# Patient Record
Sex: Female | Born: 1950 | Race: White | Hispanic: No | Marital: Married | Smoking: Former smoker
Health system: Southern US, Community
[De-identification: ages and names within clinical notes are randomized; demographics above are authoritative.]

## PROBLEM LIST (undated history)

## (undated) DIAGNOSIS — R42 Dizziness and giddiness: Secondary | ICD-10-CM

## (undated) DIAGNOSIS — E119 Type 2 diabetes mellitus without complications: Secondary | ICD-10-CM

## (undated) DIAGNOSIS — E785 Hyperlipidemia, unspecified: Secondary | ICD-10-CM

## (undated) DIAGNOSIS — F419 Anxiety disorder, unspecified: Secondary | ICD-10-CM

## (undated) DIAGNOSIS — E039 Hypothyroidism, unspecified: Secondary | ICD-10-CM

## (undated) DIAGNOSIS — K219 Gastro-esophageal reflux disease without esophagitis: Secondary | ICD-10-CM

## (undated) DIAGNOSIS — I10 Essential (primary) hypertension: Secondary | ICD-10-CM

## (undated) HISTORY — PX: APPENDECTOMY: SHX54

## (undated) HISTORY — PX: NO PAST SURGERIES: SHX2092

## (undated) HISTORY — DX: Anxiety disorder, unspecified: F41.9

## (undated) HISTORY — DX: Type 2 diabetes mellitus without complications: E11.9

## (undated) HISTORY — DX: Essential (primary) hypertension: I10

## (undated) HISTORY — DX: Hyperlipidemia, unspecified: E78.5

---

## 1999-09-05 LAB — HM MAMMOGRAPHY: HM MAMMO: NORMAL

## 2010-01-02 ENCOUNTER — Ambulatory Visit: Payer: Self-pay | Admitting: Family Medicine

## 2011-01-24 ENCOUNTER — Ambulatory Visit: Payer: Self-pay | Admitting: Family Medicine

## 2012-05-21 ENCOUNTER — Ambulatory Visit: Payer: Self-pay | Admitting: Family Medicine

## 2014-07-04 LAB — HEMOGLOBIN A1C: Hgb A1c MFr Bld: 6.4 % — AB (ref 4.0–6.0)

## 2014-10-27 LAB — LIPID PANEL
Cholesterol: 158 mg/dL (ref 0–200)
HDL: 62 mg/dL (ref 35–70)
LDL CALC: 78 mg/dL
Triglycerides: 92 mg/dL (ref 40–160)

## 2014-10-27 LAB — HEPATIC FUNCTION PANEL
ALT: 51 U/L — AB (ref 7–35)
AST: 37 U/L — AB (ref 13–35)

## 2014-10-27 LAB — BASIC METABOLIC PANEL
BUN: 18 mg/dL (ref 4–21)
CREATININE: 0.8 mg/dL (ref 0.5–1.1)
GLUCOSE: 151 mg/dL
Potassium: 4.6 mmol/L (ref 3.4–5.3)
Sodium: 141 mmol/L (ref 137–147)

## 2015-02-26 ENCOUNTER — Other Ambulatory Visit: Payer: Self-pay | Admitting: Family Medicine

## 2015-02-26 DIAGNOSIS — E119 Type 2 diabetes mellitus without complications: Secondary | ICD-10-CM

## 2015-04-15 DIAGNOSIS — Z23 Encounter for immunization: Secondary | ICD-10-CM | POA: Diagnosis not present

## 2015-05-02 DIAGNOSIS — F439 Reaction to severe stress, unspecified: Secondary | ICD-10-CM

## 2015-05-02 DIAGNOSIS — E119 Type 2 diabetes mellitus without complications: Secondary | ICD-10-CM | POA: Insufficient documentation

## 2015-05-02 DIAGNOSIS — E1159 Type 2 diabetes mellitus with other circulatory complications: Secondary | ICD-10-CM | POA: Insufficient documentation

## 2015-05-02 DIAGNOSIS — I10 Essential (primary) hypertension: Secondary | ICD-10-CM

## 2015-05-02 DIAGNOSIS — E785 Hyperlipidemia, unspecified: Secondary | ICD-10-CM

## 2015-05-02 DIAGNOSIS — F419 Anxiety disorder, unspecified: Secondary | ICD-10-CM

## 2015-05-02 DIAGNOSIS — E1169 Type 2 diabetes mellitus with other specified complication: Secondary | ICD-10-CM | POA: Insufficient documentation

## 2015-05-04 ENCOUNTER — Ambulatory Visit: Payer: Self-pay | Admitting: Family Medicine

## 2015-05-23 ENCOUNTER — Other Ambulatory Visit: Payer: Self-pay | Admitting: Family Medicine

## 2015-05-23 DIAGNOSIS — E1169 Type 2 diabetes mellitus with other specified complication: Secondary | ICD-10-CM

## 2015-05-23 DIAGNOSIS — E785 Hyperlipidemia, unspecified: Principal | ICD-10-CM

## 2015-05-23 MED ORDER — ATORVASTATIN CALCIUM 40 MG PO TABS: 40.0000 mg | ORAL_TABLET | Freq: Every day | ORAL | Status: DC

## 2015-05-23 NOTE — Telephone Encounter (Signed)
Patient has been advised. KW 

## 2015-05-23 NOTE — Telephone Encounter (Signed)
Refill request. KW 

## 2015-05-23 NOTE — Telephone Encounter (Signed)
Med refilled.

## 2015-05-23 NOTE — Telephone Encounter (Signed)
Pt need s refill on atorvastatin 40.mg  Bevelyn Buckles  Call bc #  [phone]  Thanks Con Memos

## 2015-05-29 ENCOUNTER — Encounter: Payer: Self-pay | Admitting: Family Medicine

## 2015-05-29 ENCOUNTER — Ambulatory Visit (INDEPENDENT_AMBULATORY_CARE_PROVIDER_SITE_OTHER): Payer: Medicare Other | Admitting: Family Medicine

## 2015-05-29 VITALS — BP 110/76 | HR 83 | Temp 98.7°F | Resp 16 | Wt 200.0 lb

## 2015-05-29 DIAGNOSIS — E119 Type 2 diabetes mellitus without complications: Secondary | ICD-10-CM | POA: Diagnosis not present

## 2015-05-29 DIAGNOSIS — Z23 Encounter for immunization: Secondary | ICD-10-CM | POA: Diagnosis not present

## 2015-05-29 DIAGNOSIS — I10 Essential (primary) hypertension: Secondary | ICD-10-CM | POA: Diagnosis not present

## 2015-05-29 LAB — POCT GLYCOSYLATED HEMOGLOBIN (HGB A1C)

## 2015-05-29 NOTE — Progress Notes (Signed)
Subjective:     Patient ID: Joyce Morton, female   DOB: 11-15-1950, 64 y.o.   MRN: [id number]  HPI  Chief Complaint  Patient presents with  . Hypertension    Patient is present in office today for a 62month follow up lov 10/18/14. Patients blood pressure at last visit was 130/74 and she was advised to continue Chlorthalidone and  Losartan. Patient reports good compliance, tolerance and symptom control  . Diabetes    Follow up from 10/18/14, patients HgbA1C in house was 6.8%, she was advised to continue Metformin HCI 500mg . Patient states that her fasting blood sugars have been >130 but in the evening her blood sugar has remained in the 90s. Patient reports good ccompliance, tolerance and symptom control while on medication. Patient denies any hypoglycemia incidents and she inspecting feet for wounds and sores on a daily basis  . Hyperlipidemia    Follow up from 10/18/14, at visit Lipid panel was ordered  States her mother will be moving to a continuing care [place] her sister lives in January. She also retired in November. Reports stress is much less in her life as she has more time for herself.   Review of Systems  Respiratory: Negative for shortness of breath.   Cardiovascular: Negative for chest pain and palpitations.       Objective:   Physical Exam  Constitutional: She appears well-developed and well-nourished. No distress.  Cardiovascular: Normal rate and regular rhythm.   Pulmonary/Chest: Breath sounds normal.  Musculoskeletal: She exhibits no edema (of lower extremities).       Assessment:    1. Type 2 diabetes mellitus without complication, without long-term current use of insulin (HCC) - POCT glycosylated hemoglobin (Hb A1C)  2. Need for Streptococcus pneumoniae vaccination - Pneumococcal conjugate vaccine 13-valent IM  3. Hypertension, essential    Plan:    Continue present medications. Encouraged regular exercise. Update labs at next o.v.

## 2015-05-29 NOTE — Patient Instructions (Signed)
Encourage daily exercise for 30 minutes or greater.

## 2015-09-11 ENCOUNTER — Other Ambulatory Visit: Payer: Self-pay | Admitting: Family Medicine

## 2015-09-11 NOTE — Telephone Encounter (Signed)
Joyce Morton I don't believe this patient sees you.  Thanks [employer]

## 2015-09-24 ENCOUNTER — Other Ambulatory Visit: Payer: Self-pay | Admitting: Family Medicine

## 2015-11-01 ENCOUNTER — Emergency Department
Admission: EM | Admit: 2015-11-01 | Discharge: 2015-11-01 | Disposition: A | Discharge status: Home / Self Care | Payer: Medicare Other | Attending: Emergency Medicine | Admitting: Emergency Medicine

## 2015-11-01 ENCOUNTER — Emergency Department: Payer: Medicare Other

## 2015-11-01 DIAGNOSIS — Z79899 Other long term (current) drug therapy: Secondary | ICD-10-CM | POA: Insufficient documentation

## 2015-11-01 DIAGNOSIS — Y999 Unspecified external cause status: Secondary | ICD-10-CM | POA: Diagnosis not present

## 2015-11-01 DIAGNOSIS — S92352A Displaced fracture of fifth metatarsal bone, left foot, initial encounter for closed fracture: Secondary | ICD-10-CM | POA: Diagnosis not present

## 2015-11-01 DIAGNOSIS — I10 Essential (primary) hypertension: Secondary | ICD-10-CM | POA: Diagnosis not present

## 2015-11-01 DIAGNOSIS — S92312A Displaced fracture of first metatarsal bone, left foot, initial encounter for closed fracture: Secondary | ICD-10-CM | POA: Diagnosis not present

## 2015-11-01 DIAGNOSIS — S92302A Fracture of unspecified metatarsal bone(s), left foot, initial encounter for closed fracture: Secondary | ICD-10-CM

## 2015-11-01 DIAGNOSIS — Y929 Unspecified place or not applicable: Secondary | ICD-10-CM | POA: Insufficient documentation

## 2015-11-01 DIAGNOSIS — S99922A Unspecified injury of left foot, initial encounter: Secondary | ICD-10-CM | POA: Diagnosis present

## 2015-11-01 DIAGNOSIS — E785 Hyperlipidemia, unspecified: Secondary | ICD-10-CM | POA: Diagnosis not present

## 2015-11-01 DIAGNOSIS — E119 Type 2 diabetes mellitus without complications: Secondary | ICD-10-CM | POA: Insufficient documentation

## 2015-11-01 DIAGNOSIS — Z87891 Personal history of nicotine dependence: Secondary | ICD-10-CM | POA: Diagnosis not present

## 2015-11-01 DIAGNOSIS — Y939 Activity, unspecified: Secondary | ICD-10-CM | POA: Diagnosis not present

## 2015-11-01 DIAGNOSIS — X501XXA Overexertion from prolonged static or awkward postures, initial encounter: Secondary | ICD-10-CM | POA: Diagnosis not present

## 2015-11-01 DIAGNOSIS — Z7984 Long term (current) use of oral hypoglycemic drugs: Secondary | ICD-10-CM | POA: Diagnosis not present

## 2015-11-01 DIAGNOSIS — S92355A Nondisplaced fracture of fifth metatarsal bone, left foot, initial encounter for closed fracture: Secondary | ICD-10-CM | POA: Diagnosis not present

## 2015-11-01 MED ORDER — OXYCODONE-ACETAMINOPHEN 5-325 MG PO TABS
1.0000 | ORAL_TABLET | Freq: Once | ORAL | Status: DC
  Filled 2015-11-01: qty 1

## 2015-11-01 MED ORDER — OXYCODONE-ACETAMINOPHEN 5-325 MG PO TABS: 1.0000 | ORAL_TABLET | Freq: Four times a day (QID) | ORAL | Status: DC | PRN

## 2015-11-01 NOTE — Discharge Instructions (Signed)
Metatarsal Fracture A metatarsal fracture is a break in a metatarsal bone. Metatarsal bones connect your toe bones to your ankle bones. CAUSES This type of fracture may be caused by:  A sudden twisting of your foot.  A fall onto your foot.  Overuse or repetitive exercise. RISK FACTORS This condition is more likely to develop in people who:  Play contact sports.  Have a bone disease.  Have a low calcium level. SYMPTOMS Symptoms of this condition include:  Pain that is worse when walking or standing.  Pain when pressing on the foot or moving the toes.  Swelling.  Bruising on the top or bottom of the foot.  A foot that appears shorter than the other one. DIAGNOSIS This condition is diagnosed with a physical exam. You may also have imaging tests, such as:  X-rays.  A CT scan.  MRI. TREATMENT Treatment for this condition depends on its severity and whether a bone has moved out of place. Treatment may involve:  Rest.  Wearing foot support such as a cast, splint, or boot for several weeks.  Using crutches.  Surgery to move bones back into the right position. Surgery is usually needed if there are many pieces of broken bone or bones that are very out of place (displaced fracture).  Physical therapy. This may be needed to help you regain full movement and strength in your foot. You will need to return to your health care provider to have X-rays taken until your bones heal. Your health care provider will look at the X-rays to make sure that your foot is healing well. HOME CARE INSTRUCTIONS  If You Have a Cast:  Do not stick anything inside the cast to scratch your skin. Doing that increases your risk of infection.  Check the skin around the cast every day. Report any concerns to your health care provider. You may put lotion on dry skin around the edges of the cast. Do not apply lotion to the skin underneath the cast.  Keep the cast clean and dry. If You Have a Splint  or a Supportive Boot:  Wear it as directed by your health care provider. Remove it only as directed by your health care provider.  Loosen it if your toes become numb and tingle, or if they turn cold and blue.  Keep it clean and dry. Bathing  Do not take baths, swim, or use a hot tub until your health care provider approves. Ask your health care provider if you can take showers. You may only be allowed to take sponge baths for bathing.  If your health care provider approves bathing and showering, cover the cast or splint with a watertight plastic bag to protect it from water. Do not let the cast or splint get wet. Managing Pain, Stiffness, and Swelling  If directed, apply ice to the injured area (if you have a splint, not a cast).  Put ice in a plastic bag.  Place a towel between your skin and the bag.  Leave the ice on for 20 minutes, 2-3 times per day.  Move your toes often to avoid stiffness and to lessen swelling.  Raise (elevate) the injured area above the level of your heart while you are sitting or lying down. Driving  Do not drive or operate heavy machinery while taking pain medicine.  Do not drive while wearing foot support on a foot that you use for driving. Activity  Return to your normal activities as directed by your health care   provider. Ask your health care provider what activities are safe for you.  Perform exercises as directed by your health care provider or physical therapist. Safety  Do not use the injured foot to support your body weight until your health care provider says that you can. Use crutches as directed by your health care provider. General Instructions  Do not put pressure on any part of the cast or splint until it is fully hardened. This may take several hours.  Do not use any tobacco products, including cigarettes, chewing tobacco, or e-cigarettes. Tobacco can delay bone healing. If you need help quitting, ask your health care  provider.  Take medicines only as directed by your health care provider.  Keep all follow-up visits as directed by your health care provider. This is important. SEEK MEDICAL CARE IF:  You have a fever.  Your cast, splint, or boot is too loose or too tight.  Your cast, splint, or boot is damaged.  Your pain medicine is not helping.  You have pain, tingling, or numbness in your foot that is not going away. SEEK IMMEDIATE MEDICAL CARE IF:  You have severe pain.  You have tingling or numbness in your foot that is getting worse.  Your foot feels cold or becomes numb.  Your foot changes color.   This information is not intended to replace advice given to you by your health care provider. Make sure you discuss any questions you have with your health care provider.   Document Released: 03/09/2002 Document Revised: 11/01/2014 Document Reviewed: 04/13/2014 Elsevier Interactive Patient Education 2016 [employer]  

## 2015-11-01 NOTE — ED Provider Notes (Signed)
[facility] Emergency Department Provider Note   ____________________________________________  Time seen: Approximately 6:34 AM  I have reviewed the triage vital signs and the nursing notes.   HISTORY  Chief Complaint Foot Injury    HPI Joyce Morton is a 65 y.o. female comes into the hospital today with some left foot pain. She reports that she twisted her foot yesterday. She thought it was a bad sprain. To put ice on it last night and took an ibuprofen but this morning when she got up she couldn't put weight on the foot. She reports that when she steps on it her pain as a 10 out of 10 in intensity but when she sitting it doesn't hurt too bad. It does hurt to move and she is concerned something may be wrong with it. The patient is still able to move her feet and her toes and has no tingling or numbness. She is here for evaluation this morning.   Past Medical History  Diagnosis Date  . Hyperlipidemia   . Hypertension   . Anxiety   . Diabetes mellitus without complication [facility])     Patient Active Problem List   Diagnosis Date Noted  . Hyperlipidemia 05/02/2015  . Anxiety disorder 05/02/2015  . Diabetes mellitus, type II ([place]) 05/02/2015  . Hypertension, essential 05/02/2015    No past surgical history on file.  Current Outpatient Rx  Name  Route  Sig  Dispense  Refill  . atorvastatin (LIPITOR) 40 MG tablet   Oral   Take 1 tablet (40 mg total) by mouth daily.   90 tablet   3   . chlorthalidone (HYGROTON) 25 MG tablet      TAKE 1 TABLET BY MOUTH DAILY.   90 tablet   1   . losartan (COZAAR) 50 MG tablet      TAKE 1 TABLET BY MOUTH EVERY DAY.   90 tablet   0   . metFORMIN (GLUCOPHAGE) 500 MG tablet      Take 1 tablet by mouth two  times daily   180 tablet   1   . oxyCODONE-acetaminophen (ROXICET) 5-325 MG tablet   Oral   Take 1 tablet by mouth every 6 (six) hours as needed.   12 tablet   0     Allergies Lisinopril  and Wellbutrin  No family history on file.  Social History Social History  Substance Use Topics  . Smoking status: Former Research scientist (life sciences)  . Smokeless tobacco: Not on file  . Alcohol Use: No    Review of Systems Constitutional: No fever/chills Eyes: No visual changes. ENT: No sore throat. Cardiovascular: Denies chest pain. Respiratory: Denies shortness of breath. Gastrointestinal: No abdominal pain.  No nausea, no vomiting.  No diarrhea.  No constipation. Genitourinary: Negative for dysuria. Musculoskeletal: Foot pain. Skin: Negative for rash. Neurological: Negative for headaches, focal weakness or numbness.  10-point ROS otherwise negative.  ____________________________________________   PHYSICAL EXAM:  VITAL SIGNS: ED Triage Vitals  Enc Vitals Group     BP 11/01/15 0610 154/84 mmHg     Pulse Rate 11/01/15 0610 109     Resp 11/01/15 0610 18     Temp 11/01/15 0610 98.7 F (37.1 C)     Temp src --      SpO2 11/01/15 0610 94 %     Weight 11/01/15 0610 199 lb (90.266 kg)     Height 11/01/15 0610 5\' 3"  (1.6 m)     Head Cir --  Peak Flow --      Pain Score --      Pain Loc --      Pain Edu? --      Excl. in [place]? --     Constitutional: Alert and oriented. Well appearing and in Mild distress. Eyes: Conjunctivae are normal. PERRL. EOMI. Head: Atraumatic. Nose: No congestion/rhinnorhea. Mouth/Throat: Mucous membranes are moist.  Oropharynx non-erythematous. Cardiovascular: Normal rate, regular rhythm. Grossly normal heart sounds.  Good peripheral circulation. Respiratory: Normal respiratory effort.  No retractions. Lungs CTAB. Gastrointestinal: Soft and nontender. No distention. Positive bowel sounds Musculoskeletal: Left foot pain with movement and to palpation laterally Neurologic:  Normal speech and language.  Skin:  Skin is warm, dry and intact.  Psychiatric: Mood and affect are normal.   ____________________________________________   LABS (all labs ordered are  listed, but only abnormal results are displayed)  Labs Reviewed - No data to display ____________________________________________  EKG  none ____________________________________________  RADIOLOGY  Left foot x-ray: Transverse fracture of the base of the left fifth metatarsal bone. ____________________________________________   PROCEDURES  Procedure(s) performed: please, see procedure note(s).   SPLINT APPLICATION Date/Time: [id number] AM Authorized by: Charlesetta Ivory P Consent: Verbal consent obtained. Risks and benefits: risks, benefits and alternatives were discussed Consent given by: patient Splint applied by: ED technician Location details: left foot Splint type: posterior  Supplies used: orthoglass Post-procedure: The splinted body part was neurovascularly unchanged following the procedure. Patient tolerance: Patient tolerated the procedure well with no immediate complications.   Critical Care performed: No  ____________________________________________   INITIAL IMPRESSION / ASSESSMENT AND PLAN / ED COURSE  Pertinent labs & imaging results that were available during my care of the patient were reviewed by me and considered in my medical decision making (see chart for details).  This is a 65 year old female who comes into the hospital today with some pain to her left lateral foot after twisting it yesterday. The patient reports it hurts a lot to put weight on her foot. It appears to the patient does have a transverse fracture of the base of the left fifth metatarsal. I will have her placed in a posterior short leg cast that goes under her foot and put her on crutches. I'll have her follow back up with orthopedic surgery. ____________________________________________   FINAL CLINICAL IMPRESSION(S) / ED DIAGNOSES  Final diagnoses:  Fracture of fifth metatarsal bone, left, closed, initial encounter      NEW MEDICATIONS STARTED DURING THIS VISIT:  New  Prescriptions   OXYCODONE-ACETAMINOPHEN (ROXICET) 5-325 MG TABLET    Take 1 tablet by mouth every 6 (six) hours as needed.     Note:  This document was prepared using Dragon voice recognition software and may include unintentional dictation errors.    Loney Hering, MD 11/01/15 [phone]

## 2015-11-01 NOTE — ED Notes (Signed)
Pt refuses crutches, states that she has a walker that she is going to use at home. Pt alert and oriented X4, active, cooperative, pt in NAD. RR even and unlabored, color WNL.

## 2015-11-01 NOTE — ED Notes (Signed)
Pt missed a step and injured left foot yesterday

## 2015-11-01 NOTE — ED Notes (Signed)
Pt's states that she is not in any pain right now, but when she puts any weight onto foot it is a 10/10 pain.

## 2015-11-01 NOTE — ED Notes (Signed)
Pt alert and oriented X4, active, cooperative, pt in NAD. RR even and unlabored, color WNL.  Pt informed to return if any life threatening symptoms occur.   

## 2015-11-15 DIAGNOSIS — S92355D Nondisplaced fracture of fifth metatarsal bone, left foot, subsequent encounter for fracture with routine healing: Secondary | ICD-10-CM | POA: Diagnosis not present

## 2015-11-28 ENCOUNTER — Encounter: Payer: Self-pay | Admitting: Family Medicine

## 2015-11-28 ENCOUNTER — Ambulatory Visit (INDEPENDENT_AMBULATORY_CARE_PROVIDER_SITE_OTHER): Payer: Medicare Other | Admitting: Family Medicine

## 2015-11-28 VITALS — BP 112/74 | HR 82 | Temp 98.6°F | Resp 16

## 2015-11-28 DIAGNOSIS — I10 Essential (primary) hypertension: Secondary | ICD-10-CM | POA: Diagnosis not present

## 2015-11-28 DIAGNOSIS — E119 Type 2 diabetes mellitus without complications: Secondary | ICD-10-CM | POA: Diagnosis not present

## 2015-11-28 DIAGNOSIS — E785 Hyperlipidemia, unspecified: Secondary | ICD-10-CM

## 2015-11-28 LAB — POCT GLYCOSYLATED HEMOGLOBIN (HGB A1C): Hemoglobin A1C: 6.9

## 2015-11-28 MED ORDER — METFORMIN HCL 500 MG PO TABS: ORAL_TABLET | ORAL | Status: DC

## 2015-11-28 MED ORDER — GLUCOSE BLOOD VI STRP: ORAL_STRIP | Status: DC

## 2015-11-28 NOTE — Patient Instructions (Signed)
We will call you with the lab results. 

## 2015-11-28 NOTE — Progress Notes (Signed)
Subjective:     Patient ID: Joyce Morton, female   DOB: Mar 17, 1951, 65 y.o.   MRN: [id number]  HPI  Chief Complaint  Patient presents with  . Diabetes    Patient comes in office today for 6 month follow up, last office visit was 05/29/15 HgbA1C was at 6.4%. Patient denies any hyperglycemia incidents, average blood sugar readings at home have been between 109-156. Patient reports good compliance, tolerance and symptom control on medication.  . Hypertension    Patient present for 6 month follow up last office visit was 05/29/15 blood pressure in house was 110/76. Patient reports good compliance, tolerance and symptom control.   . Hyperlipidemia    Patient present for 6 month follow up last office visit was 05/29/15, we encouranged patient regular exercise and continue present medication. Patient reports that she signed up for [employer] and was remaining active until injury to right foot.   Currently wearing a walking boot/crutch (Dr. Sabra Heck, orthopedics) due to a left  metatarsal fracture which has limited her ability to exercise.    Review of Systems  Respiratory: Negative for shortness of breath.   Cardiovascular: Negative for chest pain and palpitations.       Objective:   Physical Exam  Constitutional: She appears well-developed and well-nourished. No distress.  Cardiovascular: Normal rate and regular rhythm.   Pulmonary/Chest: Breath sounds normal.  Musculoskeletal: She exhibits no edema (of lower extremity.).       Assessment:    1. Type 2 diabetes mellitus without complication, without long-term current use of insulin ([place]): will reevaluate once she is active again. - POCT glycosylated hemoglobin (Hb A1C) - metFORMIN (GLUCOPHAGE) 500 MG tablet; Take 1 tablet by mouth two  times daily with a meal  Dispense: 180 tablet; Refill: 1 - glucose blood test strip; Use as instructed  Dispense: 100 each; Refill: 3  2. Hyperlipidemia - Lipid panel  3. Hypertension,  essential - Comprehensive metabolic panel    Plan:    Further f/u in 3 months and pending labs.

## 2015-11-29 ENCOUNTER — Telehealth: Payer: Self-pay

## 2015-11-29 LAB — COMPREHENSIVE METABOLIC PANEL
A/G RATIO: 2 (ref 1.2–2.2)
ALBUMIN: 4.9 g/dL — AB (ref 3.6–4.8)
ALT: 28 IU/L (ref 0–32)
AST: 25 IU/L (ref 0–40)
Alkaline Phosphatase: 71 IU/L (ref 39–117)
BILIRUBIN TOTAL: 0.9 mg/dL (ref 0.0–1.2)
BUN / CREAT RATIO: 26 (ref 12–28)
BUN: 20 mg/dL (ref 8–27)
CHLORIDE: 94 mmol/L — AB (ref 96–106)
CO2: 27 mmol/L (ref 18–29)
Calcium: 9.9 mg/dL (ref 8.7–10.3)
Creatinine, Ser: 0.76 mg/dL (ref 0.57–1.00)
GFR calc non Af Amer: 83 mL/min/{1.73_m2} (ref >59)
GFR, EST AFRICAN AMERICAN: 95 mL/min/{1.73_m2} (ref >59)
GLOBULIN, TOTAL: 2.4 g/dL (ref 1.5–4.5)
Glucose: 152 mg/dL — ABNORMAL HIGH (ref 65–99)
POTASSIUM: 4.7 mmol/L (ref 3.5–5.2)
Sodium: 142 mmol/L (ref 134–144)
TOTAL PROTEIN: 7.3 g/dL (ref 6.0–8.5)

## 2015-11-29 LAB — LIPID PANEL
Chol/HDL Ratio: 2.7 ratio units (ref 0.0–4.4)
Cholesterol, Total: 157 mg/dL (ref 100–199)
HDL: 59 mg/dL (ref >39)
LDL Calculated: 78 mg/dL (ref 0–99)
Triglycerides: 102 mg/dL (ref 0–149)
VLDL CHOLESTEROL CAL: 20 mg/dL (ref 5–40)

## 2015-11-29 NOTE — Telephone Encounter (Signed)
[id number]-KW 

## 2015-11-29 NOTE — Telephone Encounter (Signed)
-----   Message from Carmon Ginsberg, [place] sent at 11/29/2015  7:52 AM EDT ----- Labs look good; continue your current medication.

## 2015-11-29 NOTE — Telephone Encounter (Signed)
Patient has been advised. KW 

## 2015-12-02 ENCOUNTER — Other Ambulatory Visit: Payer: Self-pay | Admitting: Family Medicine

## 2015-12-19 DIAGNOSIS — S92355D Nondisplaced fracture of fifth metatarsal bone, left foot, subsequent encounter for fracture with routine healing: Secondary | ICD-10-CM | POA: Diagnosis not present

## 2016-01-16 DIAGNOSIS — S92355D Nondisplaced fracture of fifth metatarsal bone, left foot, subsequent encounter for fracture with routine healing: Secondary | ICD-10-CM | POA: Diagnosis not present

## 2016-01-25 ENCOUNTER — Ambulatory Visit (INDEPENDENT_AMBULATORY_CARE_PROVIDER_SITE_OTHER): Payer: Medicare Other | Admitting: Family Medicine

## 2016-01-25 ENCOUNTER — Encounter: Payer: Self-pay | Admitting: Family Medicine

## 2016-01-25 VITALS — BP 106/80 | HR 82 | Temp 98.4°F | Resp 17 | Wt 203.6 lb

## 2016-01-25 DIAGNOSIS — J40 Bronchitis, not specified as acute or chronic: Secondary | ICD-10-CM

## 2016-01-25 MED ORDER — BENZONATATE 100 MG PO CAPS: ORAL_CAPSULE | ORAL | 0 refills | Status: DC

## 2016-01-25 MED ORDER — AZITHROMYCIN 250 MG PO TABS: ORAL_TABLET | ORAL | 0 refills | Status: DC

## 2016-01-25 NOTE — Patient Instructions (Signed)
Start the antibiotic if cough increasing in frequency or sputum turns yellow. Continue expectorants.

## 2016-01-25 NOTE — Progress Notes (Signed)
Subjective:     Patient ID: Joyce Morton, female   DOB: 05/03/1951, 65 y.o.   MRN: [id number]  HPI  Chief Complaint  Patient presents with  . Cough    Patient comes in office today with concerns of painful cough for the past 3 weeks, associated with cough patient states that she has had a fever high of 100.0. Patient reports taking otc Tussin for relief.   States she initially had a scratchy throat but no sinus congestion. Fever has abated and cough is occasionally productive of clear phlegm.   Review of Systems     Objective:   Physical Exam  Cardiovascular: Normal rate and regular rhythm.   Pulmonary/Chest: Breath sounds normal. She has no wheezes.       Assessment:    1. Bronchitis: suspect post viral - benzonatate (TESSALON) 100 MG capsule; One or two every 8 hours as needed for cough  Dispense: 30 capsule; Refill: 0 - azithromycin (ZITHROMAX Z-PAK) 250 MG tablet; 2 pills the first day then one pill daily  Dispense: 6 each; Refill: 0    Plan:    Start abx if cough worsens or purulent sputum.

## 2016-02-28 ENCOUNTER — Ambulatory Visit: Payer: Medicare Other | Admitting: Family Medicine

## 2016-03-01 ENCOUNTER — Encounter: Payer: Self-pay | Admitting: Family Medicine

## 2016-03-01 ENCOUNTER — Ambulatory Visit (INDEPENDENT_AMBULATORY_CARE_PROVIDER_SITE_OTHER): Payer: Medicare Other | Admitting: Family Medicine

## 2016-03-01 VITALS — BP 110/72 | HR 84 | Temp 98.6°F | Resp 16 | Wt 203.0 lb

## 2016-03-01 DIAGNOSIS — E119 Type 2 diabetes mellitus without complications: Secondary | ICD-10-CM

## 2016-03-01 DIAGNOSIS — I10 Essential (primary) hypertension: Secondary | ICD-10-CM | POA: Diagnosis not present

## 2016-03-01 LAB — POCT GLYCOSYLATED HEMOGLOBIN (HGB A1C)

## 2016-03-01 MED ORDER — ACCU-CHEK SOFT TOUCH LANCETS MISC: 1 refills | Status: DC

## 2016-03-01 NOTE — Progress Notes (Addendum)
Subjective:     Patient ID: Joyce Morton, female   DOB: 01/15/1951, 65 y.o.   MRN: [id number]  HPI  Chief Complaint  Patient presents with  . Diabetes    Patient comes into office for 3 month follow up, last office visit was 11/28/15. At last offive visit patient was advised to continue Metformin 500mg , HgbA1C in office was 6.9%. Patient reports fasting blood sugars at home have been ranging from 92-171. Patient reports good compliance, and fair symptom control.   . Hypertension    3 month follow up  . Cough    Patient reports cough productive of phlegm for the past 3 weeks.   States she has residual cough from bronchitis last month with occasional production of clear phlegm. Reports she did take the Zithromax. Still recovering from her left fifth metatarsal fracture though is slowly increasing her walking and exercise times.    Review of Systems     Objective:   Physical Exam  Constitutional: She appears well-developed and well-nourished. No distress.  Lungs: clear Heart: RRR without murmur        Lower extremities: no edema; pedal pulses intact, sensation to monofilament intact, no wounds noted.     Assessment:    1. Type 2 diabetes mellitus without complication, without long-term current use of insulin (HCC): controlled - POCT glycosylated hemoglobin (Hb A1C) - Lancets (ACCU-CHEK SOFT TOUCH) lancets; Use as instructed  Dispense: 100 each; Refill: 1  2. Hypertension, essential: stable    Plan:    Will get flu vaccine at the pharmacy. Due for Pneumovax 23 after November. Encouraged gradually increasing her exercise time as her foot pain allows.

## 2016-03-01 NOTE — Patient Instructions (Signed)
Get the flu vaccine. Pneumovax 23 is due after November. Continue to gradually increase exercise as your foot pain allows

## 2016-03-07 ENCOUNTER — Other Ambulatory Visit: Payer: Self-pay | Admitting: Family Medicine

## 2016-03-07 ENCOUNTER — Telehealth: Payer: Self-pay | Admitting: Family Medicine

## 2016-03-07 DIAGNOSIS — E119 Type 2 diabetes mellitus without complications: Secondary | ICD-10-CM

## 2016-03-07 MED ORDER — ACCU-CHEK MULTICLIX LANCETS MISC: 3 refills | Status: DC

## 2016-03-07 NOTE — Telephone Encounter (Signed)
done

## 2016-03-07 NOTE — Telephone Encounter (Signed)
LMTCB

## 2016-03-07 NOTE — Telephone Encounter (Signed)
Patient is requesting accu-chek multi click lancets be sent to [place]. Patient states accu-chek soft touch lancets are incorrect.

## 2016-03-07 NOTE — Telephone Encounter (Signed)
Pt is requesting to speak with [facility] nurse about her lancets. Pt stated that she was supposed to get multi click and she would rather speak to the nurse. Thanks TNP

## 2016-03-11 DIAGNOSIS — Z23 Encounter for immunization: Secondary | ICD-10-CM | POA: Diagnosis not present

## 2016-03-24 ENCOUNTER — Other Ambulatory Visit: Payer: Self-pay | Admitting: Family Medicine

## 2016-06-11 ENCOUNTER — Other Ambulatory Visit: Payer: Self-pay | Admitting: Family Medicine

## 2016-06-11 DIAGNOSIS — E1169 Type 2 diabetes mellitus with other specified complication: Secondary | ICD-10-CM

## 2016-06-11 DIAGNOSIS — E785 Hyperlipidemia, unspecified: Principal | ICD-10-CM

## 2016-08-13 ENCOUNTER — Other Ambulatory Visit: Payer: Self-pay | Admitting: Family Medicine

## 2016-08-13 DIAGNOSIS — E119 Type 2 diabetes mellitus without complications: Secondary | ICD-10-CM

## 2016-08-14 ENCOUNTER — Ambulatory Visit (INDEPENDENT_AMBULATORY_CARE_PROVIDER_SITE_OTHER): Payer: Medicare Other | Admitting: Physician Assistant

## 2016-08-14 ENCOUNTER — Encounter: Payer: Self-pay | Admitting: Physician Assistant

## 2016-08-14 VITALS — BP 144/84 | HR 104 | Temp 102.9°F | Resp 20 | Wt 198.0 lb

## 2016-08-14 DIAGNOSIS — J111 Influenza due to unidentified influenza virus with other respiratory manifestations: Secondary | ICD-10-CM

## 2016-08-14 MED ORDER — OSELTAMIVIR PHOSPHATE 75 MG PO CAPS: 75.0000 mg | ORAL_CAPSULE | Freq: Two times a day (BID) | ORAL | 0 refills | Status: AC

## 2016-08-14 NOTE — Progress Notes (Signed)
[place]  Chief Complaint  Patient presents with  . URI    Subjective:    Patient ID: Joyce Morton, female    DOB: [birth year], 66 y.o.   MRN: [id number]  Upper Respiratory Infection: AAFIYA Morton is a30 y.o. female with a past medical history significant for Type II DM on metformin,complaining of symptoms of a URI. Symptoms include congestion, cough, fever and sore throat. Onset of symptoms was 1 day ago, gradually worsening since that time. She also c/o left ear pressure/pain, achiness, congestion, low grade fever, nasal congestion and post nasal drip for the past 1 days . Also reporting myalgias. Some nausea, no vomiting, little appetite.  She is drinking plenty of fluids. Evaluation to date: none. Treatment to date: decongestants. The treatment has provided no relief.   Review of Systems  Constitutional: Positive for chills, fatigue and fever (100.8 yesterday). Negative for activity change, appetite change, diaphoresis and unexpected weight change.  HENT: Positive for congestion, ear pain (Some left ear pain), postnasal drip, rhinorrhea and sore throat. Negative for ear discharge, nosebleeds, sinus pain, sinus pressure, sneezing, tinnitus, trouble swallowing and voice change.   Eyes: Positive for discharge. Negative for photophobia, pain, redness, itching and visual disturbance.  Respiratory: Positive for cough. Negative for apnea, choking, chest tightness, shortness of breath, wheezing and stridor.   Gastrointestinal: Positive for nausea. Negative for abdominal distention, abdominal pain, anal bleeding, blood in stool, constipation, diarrhea, rectal pain and vomiting.  Musculoskeletal: Positive for myalgias.  Neurological: Positive for headaches. Negative for dizziness and light-headedness.       Objective:   BP (!) 144/84 (BP Location: Left Arm, Patient Position: Sitting, Cuff Size: Large)   Pulse (!) 104   Temp (!) 102.9 F  (39.4 C) (Oral)   Resp 20   Wt 198 lb (89.8 kg)   SpO2 95%   BMI 35.07 kg/m   Patient Active Problem List   Diagnosis Date Noted  . Hyperlipidemia 05/02/2015  . Diabetes mellitus, type II ([place]) 05/02/2015  . Hypertension, essential 05/02/2015    Outpatient Encounter Prescriptions as of 08/14/2016  Medication Sig  . atorvastatin (LIPITOR) 40 MG tablet TAKE 1 TABLET(40 MG) BY MOUTH DAILY  . chlorthalidone (HYGROTON) 25 MG tablet TAKE 1 TABLET BY MOUTH DAILY  . glucose blood test strip Use as instructed  . Lancets (ACCU-CHEK MULTICLIX) lancets Use as instructed  . losartan (COZAAR) 50 MG tablet TAKE 1 TABLET BY MOUTH EVERY DAY  . metFORMIN (GLUCOPHAGE) 500 MG tablet TAKE 1 TABLET BY MOUTH TWICE DAILY WITH A MEAL  . [DISCONTINUED] oxyCODONE-acetaminophen (ROXICET) 5-325 MG tablet Take 1 tablet by mouth every 6 (six) hours as needed.  Marland Kitchen oseltamivir (TAMIFLU) 75 MG capsule Take 1 capsule (75 mg total) by mouth 2 (two) times daily.   No facility-administered encounter medications on file as of 08/14/2016.     Allergies  Allergen Reactions  . Lisinopril Cough  . Wellbutrin [Bupropion]        Physical Exam  Constitutional: She appears well-developed and well-nourished. She appears ill.  HENT:  Right Ear: External ear normal.  Left Ear: External ear normal.  Mouth/Throat: Oropharynx is clear and moist. No oropharyngeal exudate.  Eyes: Right eye exhibits discharge. Left eye exhibits discharge.  Neck: Neck supple.  Cardiovascular: Normal rate and regular rhythm.   Pulmonary/Chest: Effort normal and breath sounds normal. No respiratory distress. She has no rales.  Lymphadenopathy:    She has no cervical adenopathy.  Skin:  Skin is warm and dry.  Psychiatric: She has a normal mood and affect. Her behavior is normal.       Assessment & Plan:   1. Influenza  Follow up precautions counseled.  - oseltamivir (TAMIFLU) 75 MG capsule; Take 1 capsule (75 mg total) by mouth 2 (two)  times daily.  Dispense: 10 capsule; Refill: 0   Recommend rest, fluids, frequent hand washing.  The entirety of the information documented in the History of Present Illness, Review of Systems and Physical Exam were personally obtained by me. Portions of this information were initially documented by Ashley Royalty, CMA and reviewed by me for thoroughness and accuracy.    Patient Instructions  Influenza, Adult Influenza ("the flu") is an infection in the lungs, nose, and throat (respiratory tract). It is caused by a virus. The flu causes many common cold symptoms, as well as a high fever and body aches. It can make you feel very sick. The flu spreads easily from person to person (is contagious). Getting a flu shot (influenza vaccination) every year is the best way to prevent the flu. Follow these instructions at home:  Take over-the-counter and prescription medicines only as told by your doctor.  Use a cool mist humidifier to add moisture (humidity) to the air in your home. This can make it easier to breathe.  Rest as needed.  Drink enough fluid to keep your pee (urine) clear or pale yellow.  Cover your mouth and nose when you cough or sneeze.  Wash your hands with soap and water often, especially after you cough or sneeze. If you cannot use soap and water, use hand sanitizer.  Stay home from work or school as told by your doctor. Unless you are visiting your doctor, try to avoid leaving home until your fever has been gone for 24 hours without the use of medicine.  Keep all follow-up visits as told by your doctor. This is important. How is this prevented?  Getting a yearly (annual) flu shot is the best way to avoid getting the flu. You may get the flu shot in late summer, fall, or winter. Ask your doctor when you should get your flu shot.  Wash your hands often or use hand sanitizer often.  Avoid contact with people who are sick during cold and flu season.  Eat healthy foods.  Drink  plenty of fluids.  Get enough sleep.  Exercise regularly. Contact a doctor if:  You get new symptoms.  You have:  Chest pain.  Watery poop (diarrhea).  A fever.  Your cough gets worse.  You start to have more mucus.  You feel sick to your stomach (nauseous).  You throw up (vomit). Get help right away if:  You start to be short of breath or have trouble breathing.  Your skin or nails turn a bluish color.  You have very bad pain or stiffness in your neck.  You get a sudden headache.  You get sudden pain in your face or ear.  You cannot stop throwing up. This information is not intended to replace advice given to you by your health care provider. Make sure you discuss any questions you have with your health care provider. Document Released: 03/26/2008 Document Revised: 11/23/2015 Document Reviewed: 04/11/2015 Elsevier Interactive Patient Education  2017 [employer].     The entirety of the information documented in the History of Present Illness, Review of Systems and Physical Exam were personally obtained by me. Portions of this information were initially  documented by Ashley Royalty, CMA and reviewed by me for thoroughness and accuracy.

## 2016-08-14 NOTE — Patient Instructions (Signed)

## 2016-08-30 ENCOUNTER — Ambulatory Visit (INDEPENDENT_AMBULATORY_CARE_PROVIDER_SITE_OTHER): Payer: Medicare Other | Admitting: Family Medicine

## 2016-08-30 ENCOUNTER — Encounter: Payer: Self-pay | Admitting: Family Medicine

## 2016-08-30 VITALS — BP 138/82 | HR 84 | Temp 98.6°F | Resp 16 | Wt 203.0 lb

## 2016-08-30 DIAGNOSIS — R05 Cough: Secondary | ICD-10-CM | POA: Diagnosis not present

## 2016-08-30 DIAGNOSIS — E119 Type 2 diabetes mellitus without complications: Secondary | ICD-10-CM

## 2016-08-30 DIAGNOSIS — I10 Essential (primary) hypertension: Secondary | ICD-10-CM

## 2016-08-30 DIAGNOSIS — R058 Other specified cough: Secondary | ICD-10-CM

## 2016-08-30 LAB — POCT GLYCOSYLATED HEMOGLOBIN (HGB A1C)

## 2016-08-30 MED ORDER — BENZONATATE 100 MG PO CAPS: ORAL_CAPSULE | ORAL | 0 refills | Status: DC

## 2016-08-30 NOTE — Progress Notes (Signed)
Subjective:     Patient ID: Joyce Morton, female   DOB: 1950/11/25, 66 y.o.   MRN: [id number]  HPI  Chief Complaint  Patient presents with  . Diabetes    Patient comes in office today for 6 month follow up, patient was last seen in offie on 03/01/16 HgbA1C 6.5%. Patient reports that her fasting blood sugar readings have been ranging from 96-158, but has not been checking over the past two weeks due to illness. Patient reports that diet has been poor but will be working on improving,  she denies any hyperglycemia incidents since last visit. Patient has good compliance and tolerance on medication.   . Hypertension    Patient returns for 6 month follow up at last visit 03/01/16 patients condition was stable, blood pressure in house was 110/72. Patient reports good compliance and tolerance on medication.  . Cough    Patient reports that she was recently seen in office and diagnosed with flu virus, patient states since she was last seen symptoms have not improved much. Today patient reports cough, chest congestion and sore throat. Patient had been taking otc Mucinex with no relief.   Reports persistent post-viral cough occasionally productive of clear sputum: "I don't feel sick." Has not been exercising since onset of her illness 2/14.  Review of Systems  Respiratory: Negative for shortness of breath.   Cardiovascular: Negative for chest pain and palpitations.       Objective:   Physical Exam  Constitutional: She appears well-developed and well-nourished. No distress.  HENT:  No posterior pharyngeal erythema; tonsils not well visualized due to strong tongue resistance.  Cardiovascular: Normal rate and regular rhythm.   Pulmonary/Chest: Breath sounds normal.  Musculoskeletal: She exhibits no edema (of lower extremities).       Assessment:    1. Type 2 diabetes mellitus without complication, without long-term current use of insulin (HCC) - POCT glycosylated hemoglobin (Hb A1C)  2.  Hypertension, essential: stable  3. Post-viral cough syndrome - benzonatate (TESSALON PERLES) 100 MG capsule; One or two pills 3 x day as needed for cough  Dispense: 30 capsule; Refill: 0    Plan:    Resume exercise program. Call if cough not improving.  At next o.v. Will update labs, neuropathy exam, and give Pneumovax 23.

## 2016-08-30 NOTE — Patient Instructions (Signed)
Let me know if your cough does not go away in the next one to two weeks. Resume your exercise program.

## 2016-11-04 ENCOUNTER — Other Ambulatory Visit: Payer: Self-pay | Admitting: Family Medicine

## 2016-11-05 NOTE — Telephone Encounter (Signed)
[name] is out of office please review chart and advise. KW

## 2016-12-02 ENCOUNTER — Other Ambulatory Visit: Payer: Self-pay | Admitting: Family Medicine

## 2016-12-03 ENCOUNTER — Ambulatory Visit (INDEPENDENT_AMBULATORY_CARE_PROVIDER_SITE_OTHER): Payer: Medicare Other | Admitting: Family Medicine

## 2016-12-03 ENCOUNTER — Encounter: Payer: Self-pay | Admitting: Family Medicine

## 2016-12-03 VITALS — BP 102/70 | HR 80 | Temp 98.0°F | Resp 16 | Wt 201.6 lb

## 2016-12-03 DIAGNOSIS — Z1211 Encounter for screening for malignant neoplasm of colon: Secondary | ICD-10-CM

## 2016-12-03 DIAGNOSIS — Z1239 Encounter for other screening for malignant neoplasm of breast: Secondary | ICD-10-CM

## 2016-12-03 DIAGNOSIS — E119 Type 2 diabetes mellitus without complications: Secondary | ICD-10-CM | POA: Diagnosis not present

## 2016-12-03 DIAGNOSIS — Z23 Encounter for immunization: Secondary | ICD-10-CM | POA: Diagnosis not present

## 2016-12-03 DIAGNOSIS — E782 Mixed hyperlipidemia: Secondary | ICD-10-CM

## 2016-12-03 DIAGNOSIS — I10 Essential (primary) hypertension: Secondary | ICD-10-CM | POA: Diagnosis not present

## 2016-12-03 DIAGNOSIS — Z1231 Encounter for screening mammogram for malignant neoplasm of breast: Secondary | ICD-10-CM | POA: Diagnosis not present

## 2016-12-03 LAB — HEMOGLOBIN A1C: Hemoglobin A1C: 7

## 2016-12-03 LAB — POCT GLYCOSYLATED HEMOGLOBIN (HGB A1C)

## 2016-12-03 NOTE — Patient Instructions (Signed)
We will call you with the lab results and the time for the G.I. Referral. Call [facility] and make an appointment. Consider updating pap smear with gyn or provider of your choice.

## 2016-12-03 NOTE — Progress Notes (Signed)
Subjective:     Patient ID: Joyce Morton, female   DOB: May 18, 1951, 66 y.o.   MRN: [id number]  HPI  Chief Complaint  Patient presents with  . Diabetes    Patient returns to office today for 3 month follow up, last office visit was 08/30/16 HgbA1C in house was 6.9%. Patient states blood sugar readings at home have been between 90-56, she has been inspecting her feet for wounds and sores and there has been no changes. Patient reports good compliance and tolerance on medication  . Hypertension    Patient returns for 3 month follow up, last visit 08/30/16 patients condition was stable and blood pressure in house was 138/82. Patient reports good compliance and tolerance on medication.   Reports not exercising much. Concerned about her mother's current living situation in independent living and planning to move her with her sister's help to a condo with 24 hour caregivers.Discussed health maintenance with her. Non-committal re: pap smear.   Review of Systems  Respiratory: Negative for shortness of breath.   Cardiovascular: Negative for chest pain and palpitations.  Skin:       States left ankle/foot seems to get cold since her left metatarsal fracture.       Objective:   Physical Exam  Constitutional: She appears well-developed and well-nourished. No distress.  Lungs: clear Heart: RRR without murmur Lower extremities: no edema; pedal pulses intact, sensation to monofilament intact, no wounds noted. Left foot feels the same temperature as right foot.    Assessment:    1. Type 2 diabetes mellitus without complication, without long-term current use of insulin (HCC) - POCT glycosylated hemoglobin (Hb A1C)  2. Mixed hyperlipidemia - Lipid panel  3. Hypertension, essential - Comprehensive metabolic panel  4. Need for vaccination against Streptococcus pneumonia - Pneumococcal polysaccharide vaccine 23-valent greater than or equal to 2yo subcutaneous/IM  5. Screen for colon cancer -  Ambulatory referral to Gastroenterology  6. Screening for breast cancer - [facility] DIGITAL SCREENING BILATERAL; Future    Plan:    Further f/u pending lab results and screening exams. Encouraged updating pap smear.

## 2016-12-04 ENCOUNTER — Telehealth: Payer: Self-pay

## 2016-12-04 LAB — LIPID PANEL
CHOLESTEROL TOTAL: 141 mg/dL (ref 100–199)
Chol/HDL Ratio: 2.6 ratio (ref 0.0–4.4)
HDL: 54 mg/dL (ref >39)
LDL Calculated: 68 mg/dL (ref 0–99)
Triglycerides: 93 mg/dL (ref 0–149)
VLDL Cholesterol Cal: 19 mg/dL (ref 5–40)

## 2016-12-04 LAB — COMPREHENSIVE METABOLIC PANEL
ALK PHOS: 66 IU/L (ref 39–117)
ALT: 23 IU/L (ref 0–32)
AST: 24 IU/L (ref 0–40)
Albumin/Globulin Ratio: 1.9 (ref 1.2–2.2)
Albumin: 4.7 g/dL (ref 3.6–4.8)
BILIRUBIN TOTAL: 0.9 mg/dL (ref 0.0–1.2)
BUN/Creatinine Ratio: 27 (ref 12–28)
BUN: 21 mg/dL (ref 8–27)
CHLORIDE: 95 mmol/L — AB (ref 96–106)
CO2: 28 mmol/L (ref 18–29)
CREATININE: 0.79 mg/dL (ref 0.57–1.00)
Calcium: 10.1 mg/dL (ref 8.7–10.3)
GFR calc Af Amer: 90 mL/min/{1.73_m2} (ref >59)
GFR calc non Af Amer: 78 mL/min/{1.73_m2} (ref >59)
GLOBULIN, TOTAL: 2.5 g/dL (ref 1.5–4.5)
Glucose: 146 mg/dL — ABNORMAL HIGH (ref 65–99)
POTASSIUM: 4.2 mmol/L (ref 3.5–5.2)
SODIUM: 140 mmol/L (ref 134–144)
Total Protein: 7.2 g/dL (ref 6.0–8.5)

## 2016-12-04 NOTE — Telephone Encounter (Signed)
-----   Message from Carmon Ginsberg, [place] sent at 12/04/2016  7:27 AM EDT ----- Labs look good. Continue current medication.

## 2016-12-04 NOTE — Telephone Encounter (Signed)
LMTCB

## 2016-12-06 ENCOUNTER — Other Ambulatory Visit: Payer: Self-pay | Admitting: Family Medicine

## 2016-12-06 DIAGNOSIS — E119 Type 2 diabetes mellitus without complications: Secondary | ICD-10-CM

## 2016-12-09 NOTE — Telephone Encounter (Signed)
Patient advised.KW 

## 2016-12-09 NOTE — Telephone Encounter (Signed)
[id number]-KW 

## 2016-12-18 ENCOUNTER — Telehealth: Payer: Self-pay

## 2016-12-18 ENCOUNTER — Other Ambulatory Visit: Payer: Self-pay

## 2016-12-18 DIAGNOSIS — Z1211 Encounter for screening for malignant neoplasm of colon: Secondary | ICD-10-CM

## 2016-12-18 NOTE — Telephone Encounter (Signed)
Gastroenterology Pre-Procedure Review  Request Date: 01/16/17 Requesting Physician: Dr. Allen Norris  PATIENT REVIEW QUESTIONS: The patient responded to the following health history questions as indicated:    1. Are you having any GI issues? no 2. Do you have a personal history of Polyps? no 3. Do you have a family history of Colon Cancer or Polyps? no 4. Diabetes Mellitus? yes 5. Joint replacements in the past 12 months?no 6. Major health problems in the past 3 months?no 7. Any artificial heart valves, MVP, or defibrillator?no    MEDICATIONS & ALLERGIES:    Patient reports the following regarding taking any anticoagulation/antiplatelet therapy:   Plavix, Coumadin, Eliquis, Xarelto, Lovenox, Pradaxa, Brilinta, or Effient? no Aspirin? yes (81 mg daily)  Patient confirms/reports the following medications:  Current Outpatient Prescriptions  Medication Sig Dispense Refill  . atorvastatin (LIPITOR) 40 MG tablet TAKE 1 TABLET(40 MG) BY MOUTH DAILY 90 tablet 3  . benzonatate (TESSALON PERLES) 100 MG capsule One or two pills 3 x day as needed for cough 30 capsule 0  . chlorthalidone (HYGROTON) 25 MG tablet TAKE 1 TABLET BY MOUTH DAILY 90 tablet 1  . glucose blood test strip Use as instructed 100 each 3  . Lancets (ACCU-CHEK MULTICLIX) lancets Use as instructed 100 each 3  . losartan (COZAAR) 50 MG tablet TAKE 1 TABLET BY MOUTH EVERY DAY 90 tablet 3  . metFORMIN (GLUCOPHAGE) 500 MG tablet TAKE 1 TABLET BY MOUTH TWICE DAILY WITH A MEAL 180 tablet 3   No current facility-administered medications for this visit.     Patient confirms/reports the following allergies:  Allergies  Allergen Reactions  . Lisinopril Cough  . Wellbutrin [Bupropion]     No orders of the defined types were placed in this encounter.   AUTHORIZATION INFORMATION Primary Insurance: 1D#: Group #:  Secondary Insurance: 1D#: Group #:  SCHEDULE INFORMATION: Date: 01/16/17 Time: Location:[facility]

## 2017-01-08 ENCOUNTER — Encounter: Payer: Self-pay | Admitting: *Deleted

## 2017-01-16 ENCOUNTER — Ambulatory Visit
Admission: RE | Admit: 2017-01-16 | Discharge: 2017-01-16 | Disposition: A | Discharge status: Home / Self Care | Payer: Medicare Other | Source: Ambulatory Visit | Attending: Gastroenterology | Admitting: Gastroenterology

## 2017-01-16 ENCOUNTER — Encounter
Admission: RE | Disposition: A | Discharge status: Home / Self Care | Payer: Self-pay | Source: Ambulatory Visit | Attending: Gastroenterology

## 2017-01-16 ENCOUNTER — Ambulatory Visit: Payer: Medicare Other | Admitting: Anesthesiology

## 2017-01-16 DIAGNOSIS — D123 Benign neoplasm of transverse colon: Secondary | ICD-10-CM | POA: Diagnosis not present

## 2017-01-16 DIAGNOSIS — Z87891 Personal history of nicotine dependence: Secondary | ICD-10-CM | POA: Diagnosis not present

## 2017-01-16 DIAGNOSIS — Z7982 Long term (current) use of aspirin: Secondary | ICD-10-CM | POA: Insufficient documentation

## 2017-01-16 DIAGNOSIS — F419 Anxiety disorder, unspecified: Secondary | ICD-10-CM | POA: Insufficient documentation

## 2017-01-16 DIAGNOSIS — K641 Second degree hemorrhoids: Secondary | ICD-10-CM | POA: Diagnosis not present

## 2017-01-16 DIAGNOSIS — K635 Polyp of colon: Secondary | ICD-10-CM

## 2017-01-16 DIAGNOSIS — Z7984 Long term (current) use of oral hypoglycemic drugs: Secondary | ICD-10-CM | POA: Diagnosis not present

## 2017-01-16 DIAGNOSIS — Z79899 Other long term (current) drug therapy: Secondary | ICD-10-CM | POA: Diagnosis not present

## 2017-01-16 DIAGNOSIS — D120 Benign neoplasm of cecum: Secondary | ICD-10-CM

## 2017-01-16 DIAGNOSIS — I10 Essential (primary) hypertension: Secondary | ICD-10-CM | POA: Insufficient documentation

## 2017-01-16 DIAGNOSIS — E119 Type 2 diabetes mellitus without complications: Secondary | ICD-10-CM | POA: Insufficient documentation

## 2017-01-16 DIAGNOSIS — E785 Hyperlipidemia, unspecified: Secondary | ICD-10-CM | POA: Insufficient documentation

## 2017-01-16 DIAGNOSIS — Z1211 Encounter for screening for malignant neoplasm of colon: Secondary | ICD-10-CM

## 2017-01-16 DIAGNOSIS — K219 Gastro-esophageal reflux disease without esophagitis: Secondary | ICD-10-CM | POA: Insufficient documentation

## 2017-01-16 DIAGNOSIS — D125 Benign neoplasm of sigmoid colon: Secondary | ICD-10-CM | POA: Diagnosis not present

## 2017-01-16 HISTORY — PX: POLYPECTOMY: SHX5525

## 2017-01-16 HISTORY — PX: COLONOSCOPY WITH PROPOFOL: SHX5780

## 2017-01-16 HISTORY — DX: Dizziness and giddiness: R42

## 2017-01-16 HISTORY — DX: Gastro-esophageal reflux disease without esophagitis: K21.9

## 2017-01-16 LAB — GLUCOSE, CAPILLARY
GLUCOSE-CAPILLARY: 161 mg/dL — AB (ref 65–99)
Glucose-Capillary: 197 mg/dL — ABNORMAL HIGH (ref 65–99)

## 2017-01-16 SURGERY — COLONOSCOPY WITH PROPOFOL
Anesthesia: General

## 2017-01-16 MED ORDER — ACETAMINOPHEN 160 MG/5ML PO SOLN: 325.0000 mg | ORAL | Status: DC | PRN

## 2017-01-16 MED ORDER — LIDOCAINE HCL (CARDIAC) 20 MG/ML IV SOLN
INTRAVENOUS | Status: DC | PRN
  Administered 2017-01-16: 50 mg via INTRAVENOUS

## 2017-01-16 MED ORDER — STERILE WATER FOR IRRIGATION IR SOLN
Status: DC | PRN
  Administered 2017-01-16: 08:00:00

## 2017-01-16 MED ORDER — LACTATED RINGERS IV SOLN
INTRAVENOUS | Status: DC
  Administered 2017-01-16: 07:00:00 via INTRAVENOUS

## 2017-01-16 MED ORDER — PROPOFOL 10 MG/ML IV BOLUS
INTRAVENOUS | Status: DC | PRN
  Administered 2017-01-16: 50 mg via INTRAVENOUS
  Administered 2017-01-16 (×3): 20 mg via INTRAVENOUS

## 2017-01-16 MED ORDER — ACETAMINOPHEN 325 MG PO TABS: 325.0000 mg | ORAL_TABLET | ORAL | Status: DC | PRN

## 2017-01-16 SURGICAL SUPPLY — 23 items

## 2017-01-16 NOTE — Anesthesia Preprocedure Evaluation (Signed)
Anesthesia Evaluation  Patient identified by MRN, date of birth, ID band Patient awake    Reviewed: Allergy & Precautions, H&P , NPO status , Patient's Chart, lab work & pertinent test results  Airway Mallampati: II  TM Distance: >3 FB Neck ROM: full    Dental no notable dental hx.    Pulmonary former smoker,    Pulmonary exam normal breath sounds clear to auscultation       Cardiovascular hypertension, Normal cardiovascular exam Rhythm:regular Rate:Normal     Neuro/Psych    GI/Hepatic GERD  ,  Endo/Other  diabetes  Renal/GU      Musculoskeletal   Abdominal   Peds  Hematology   Anesthesia Other Findings   Reproductive/Obstetrics                             Anesthesia Physical Anesthesia Plan  ASA: II  Anesthesia Plan: General   Post-op Pain Management:    Induction:   PONV Risk Score and Plan: 3 and Propofol  Airway Management Planned:   Additional Equipment:   Intra-op Plan:   Post-operative Plan:   Informed Consent: I have reviewed the patients History and Physical, chart, labs and discussed the procedure including the risks, benefits and alternatives for the proposed anesthesia with the patient or authorized representative who has indicated his/her understanding and acceptance.     Plan Discussed with: CRNA  Anesthesia Plan Comments:         Anesthesia Quick Evaluation

## 2017-01-16 NOTE — Anesthesia Procedure Notes (Signed)
Performed by: Makaila Windle Pre-anesthesia Checklist: Patient identified, Emergency Drugs available, Suction available, Timeout performed and Patient being monitored Patient Re-evaluated:Patient Re-evaluated prior to induction Oxygen Delivery Method: Nasal cannula Placement Confirmation: positive ETCO2       

## 2017-01-16 NOTE — Anesthesia Postprocedure Evaluation (Signed)
Anesthesia Post Note  Patient: Joyce Morton  Procedure(s) Performed: Procedure(s) (LRB): COLONOSCOPY WITH PROPOFOL (N/A) POLYPECTOMY (N/A)  Patient location during evaluation: PACU Anesthesia Type: General Level of consciousness: awake and alert and oriented Pain management: satisfactory to patient Vital Signs Assessment: post-procedure vital signs reviewed and stable Respiratory status: spontaneous breathing, nonlabored ventilation and respiratory function stable Cardiovascular status: blood pressure returned to baseline and stable Postop Assessment: Adequate PO intake and No signs of nausea or vomiting Anesthetic complications: no    Raliegh Ip

## 2017-01-16 NOTE — H&P (Signed)
   Lucilla Lame, MD [address] [place], [place] [address] Phone: [phone] Fax : [phone]  Primary Care Physician:  Jerrol Banana., MD Primary Gastroenterologist:  Dr. Allen Norris  Pre-Procedure History & Physical: HPI:  NANDANA KROLIKOWSKI is a 66 y.o. female is here for a screening colonoscopy.   Past Medical History:  Diagnosis Date  . Anxiety   . Diabetes mellitus without complication ([place])   . GERD (gastroesophageal reflux disease)   . Hyperlipidemia   . Hypertension   . Vertigo    only a few brief episodes at night    Past Surgical History:  Procedure Laterality Date  . NO PAST SURGERIES      Prior to Admission medications   Medication Sig Start Date End Date Taking? Authorizing Provider  aspirin 81 MG tablet Take 81 mg by mouth daily.   Yes [provider]  atorvastatin (LIPITOR) 40 MG tablet TAKE 1 TABLET(40 MG) BY MOUTH DAILY 06/11/16  Yes Carmon Ginsberg, PA  CALCIUM PO Take by mouth daily.   Yes [provider]  chlorthalidone (HYGROTON) 25 MG tablet TAKE 1 TABLET BY MOUTH DAILY 11/05/16  Yes Birdie Sons, MD  losartan (COZAAR) 50 MG tablet TAKE 1 TABLET BY MOUTH EVERY DAY 12/02/16  Yes Carmon Ginsberg, PA  metFORMIN (GLUCOPHAGE) 500 MG tablet TAKE 1 TABLET BY MOUTH TWICE DAILY WITH A MEAL 12/06/16  Yes Carmon Ginsberg, PA  Multiple Vitamin (MULTIVITAMIN) tablet Take 1 tablet by mouth daily.   Yes [provider]  omeprazole (PRILOSEC) 20 MG capsule Take 20 mg by mouth daily.   Yes [provider]  glucose blood test strip Use as instructed 11/28/15   Carmon Ginsberg, PA  Lancets (ACCU-CHEK MULTICLIX) lancets Use as instructed 03/07/16   Carmon Ginsberg, PA    Allergies as of 12/18/2016 - Review Complete 12/03/2016  Allergen Reaction Noted  . Lisinopril Cough 05/02/2015  . Wellbutrin [bupropion]  05/02/2015    History reviewed. No pertinent family history.  Social History   Social History  . Marital  status: Married    Spouse name: N/A  . Number of children: N/A  . Years of education: N/A   Occupational History  . Not on file.   Social History Main Topics  . Smoking status: Former Smoker    Quit date: 2008  . Smokeless tobacco: Never Used  . Alcohol use No  . Drug use: No  . Sexual activity: Not on file   Other Topics Concern  . Not on file   Social History Narrative  . No narrative on file    Review of Systems: See HPI, otherwise negative ROS  Physical Exam: BP (!) 141/82   Pulse 95   Temp 97.7 F (36.5 C) (Temporal)   Ht 5\' 3"  (1.6 m)   Wt 193 lb (87.5 kg)   SpO2 97%   BMI 34.19 kg/m  General:   Alert,  pleasant and cooperative in NAD Head:  Normocephalic and atraumatic. Neck:  Supple; no masses or thyromegaly. Lungs:  Clear throughout to auscultation.    Heart:  Regular rate and rhythm. Abdomen:  Soft, nontender and nondistended. Normal bowel sounds, without guarding, and without rebound.   Neurologic:  Alert and  oriented x4;  grossly normal neurologically.  Impression/Plan: BETHANNIE IGLEHART is now here to undergo a screening colonoscopy.  Risks, benefits, and alternatives regarding colonoscopy have been reviewed with the patient.  Questions have been answered.  All parties agreeable.

## 2017-01-16 NOTE — Transfer of Care (Signed)
Immediate Anesthesia Transfer of Care Note  Patient: Joyce Morton  Procedure(s) Performed: Procedure(s) with comments: COLONOSCOPY WITH PROPOFOL (N/A) - Diabetic - oral meds POLYPECTOMY (N/A)  Patient Location: PACU  Anesthesia Type: General  Level of Consciousness: awake, alert  and patient cooperative  Airway and Oxygen Therapy: Patient Spontanous Breathing and Patient connected to supplemental oxygen  Post-op Assessment: Post-op Vital signs reviewed, Patient's Cardiovascular Status Stable, Respiratory Function Stable, Patent Airway and No signs of Nausea or vomiting  Post-op Vital Signs: Reviewed and stable  Complications: No apparent anesthesia complications

## 2017-01-16 NOTE — Op Note (Signed)
[facility] Gastroenterology Patient Name: Joyce Morton Procedure Date: 01/16/2017 8:11 AM MRN: [id number] Account #: [id number] Date of Birth: May 18, 1951 Admit Type: Outpatient Age: 66 Room: [facility] OR ROOM 01 Gender: Female Note Status: Finalized Procedure:            Colonoscopy Indications:          Screening for colorectal malignant neoplasm Providers:            Lucilla Lame MD, MD Referring MD:         Janine Ores. Rosanna Randy, MD (Referring MD) Medicines:            Propofol per Anesthesia Complications:        No immediate complications. Procedure:            Pre-Anesthesia Assessment:                       - Prior to the procedure, a History and Physical was                        performed, and patient medications and allergies were                        reviewed. The patient's tolerance of previous                        anesthesia was also reviewed. The risks and benefits of                        the procedure and the sedation options and risks were                        discussed with the patient. All questions were                        answered, and informed consent was obtained. Prior                        Anticoagulants: The patient has taken no previous                        anticoagulant or antiplatelet agents. ASA Grade                        Assessment: II - A patient with mild systemic disease.                        After reviewing the risks and benefits, the patient was                        deemed in satisfactory condition to undergo the                        procedure.                       After obtaining informed consent, the colonoscope was                        passed under direct vision. Throughout the procedure,  the patient's blood pressure, pulse, and oxygen                        saturations were monitored continuously. The Olympus                        CF-HQ190L Colonoscope (S#. [id number]) was introduced                         through the anus and advanced to the the cecum,                        identified by appendiceal orifice and ileocecal valve.                        The colonoscopy was performed without difficulty. The                        patient tolerated the procedure well. The quality of                        the bowel preparation was excellent. Findings:      The perianal and digital rectal examinations were normal.      A 9 mm polyp was found in the cecum. The polyp was pedunculated. The       polyp was removed with a cold snare. Resection and retrieval were       complete.      Two sessile polyps were found in the transverse colon. The polyps were 3       to 4 mm in size. These polyps were removed with a cold snare. Resection       and retrieval were complete.      A 3 mm polyp was found in the sigmoid colon. The polyp was sessile. The       polyp was removed with a cold snare. Resection and retrieval were       complete.      Non-bleeding internal hemorrhoids were found during retroflexion. The       hemorrhoids were Grade II (internal hemorrhoids that prolapse but reduce       spontaneously). Impression:           - One 9 mm polyp in the cecum, removed with a cold                        snare. Resected and retrieved.                       - Two 3 to 4 mm polyps in the transverse colon, removed                        with a cold snare. Resected and retrieved.                       - One 3 mm polyp in the sigmoid colon, removed with a                        cold snare. Resected and retrieved.                       -  Non-bleeding internal hemorrhoids. Recommendation:       - Discharge patient to home.                       - Resume previous diet.                       - Continue present medications.                       - Await pathology results.                       - Repeat colonoscopy in 5 years if polyp adenoma and 10                        years if  hyperplastic Procedure Code(s):    --- Professional ---                       [phone], Colonoscopy, flexible; with removal of tumor(s),                        polyp(s), or other lesion(s) by snare technique Diagnosis Code(s):    --- Professional ---                       Z12.11, Encounter for screening for malignant neoplasm                        of colon                       D12.0, Benign neoplasm of cecum                       D12.5, Benign neoplasm of sigmoid colon                       D12.3, Benign neoplasm of transverse colon (hepatic                        flexure or splenic flexure) CPT copyright 2016 American Medical Association. All rights reserved. The codes documented in this report are preliminary and upon coder review may  be revised to meet current compliance requirements. Lucilla Lame MD, MD 01/16/2017 8:28:12 AM This report has been signed electronically. Number of Addenda: 0 Note Initiated On: 01/16/2017 8:11 AM Scope Withdrawal Time: 0 hours 8 minutes 58 seconds  Total Procedure Duration: 0 hours 12 minutes 5 seconds       [facility]

## 2017-01-17 ENCOUNTER — Encounter: Payer: Self-pay | Admitting: Gastroenterology

## 2017-01-20 ENCOUNTER — Encounter: Payer: Self-pay | Admitting: Gastroenterology

## 2017-01-20 ENCOUNTER — Telehealth: Payer: Self-pay | Admitting: Family Medicine

## 2017-01-20 NOTE — Telephone Encounter (Signed)
OPEN IN ERROR 

## 2017-02-02 ENCOUNTER — Encounter: Payer: Self-pay | Admitting: Gastroenterology

## 2017-03-06 ENCOUNTER — Ambulatory Visit: Payer: Medicare Other | Admitting: Family Medicine

## 2017-03-12 ENCOUNTER — Encounter: Payer: Self-pay | Admitting: Physician Assistant

## 2017-03-12 ENCOUNTER — Ambulatory Visit (INDEPENDENT_AMBULATORY_CARE_PROVIDER_SITE_OTHER): Payer: Medicare Other | Admitting: Physician Assistant

## 2017-03-12 VITALS — BP 126/74 | HR 84 | Temp 99.1°F | Resp 16 | Wt 199.0 lb

## 2017-03-12 DIAGNOSIS — L089 Local infection of the skin and subcutaneous tissue, unspecified: Secondary | ICD-10-CM

## 2017-03-12 MED ORDER — DOXYCYCLINE HYCLATE 100 MG PO TABS: 100.0000 mg | ORAL_TABLET | Freq: Two times a day (BID) | ORAL | 0 refills | Status: AC

## 2017-03-12 NOTE — Patient Instructions (Signed)
Skin Abscess A skin abscess is an infected area on or under your skin that contains pus and other material. An abscess can happen almost anywhere on your body. Some abscesses break open (rupture) on their own. Most continue to get worse unless they are treated. The infection can spread deeper into the body and into your blood, which can make you feel sick. Treatment usually involves draining the abscess. Follow these instructions at home: Abscess Care  If you have an abscess that has not drained, place a warm, clean, wet washcloth over the abscess several times a day. Do this as told by your doctor.  Follow instructions from your doctor about how to take care of your abscess. Make sure you: ? Cover the abscess with a bandage (dressing). ? Change your bandage or gauze as told by your doctor. ? Wash your hands with soap and water before you change the bandage or gauze. If you cannot use soap and water, use hand sanitizer.  Check your abscess every day for signs that the infection is getting worse. Check for: ? More redness, swelling, or pain. ? More fluid or blood. ? Warmth. ? More pus or a bad smell. Medicines   Take over-the-counter and prescription medicines only as told by your doctor.  If you were prescribed an antibiotic medicine, take it as told by your doctor. Do not stop taking the antibiotic even if you start to feel better. General instructions  To avoid spreading the infection: ? Do not share personal care items, towels, or hot tubs with others. ? Avoid making skin-to-skin contact with other people.  Keep all follow-up visits as told by your doctor. This is important. Contact a doctor if:  You have more redness, swelling, or pain around your abscess.  You have more fluid or blood coming from your abscess.  Your abscess feels warm when you touch it.  You have more pus or a bad smell coming from your abscess.  You have a fever.  Your muscles ache.  You have  chills.  You feel sick. Get help right away if:  You have very bad (severe) pain.  You see red streaks on your skin spreading away from the abscess. This information is not intended to replace advice given to you by your health care provider. Make sure you discuss any questions you have with your health care provider. Document Released: 12/04/2007 Document Revised: 02/11/2016 Document Reviewed: 04/26/2015 Elsevier Interactive Patient Education  2018 Elsevier Inc.  

## 2017-03-12 NOTE — Progress Notes (Signed)
Patient: Joyce Morton Female    DOB: Aug 18, 1950   66 y.o.   MRN: [id number] Visit Date: 03/12/2017  Today's Provider: Trinna Post, PA-C   Chief Complaint  Patient presents with  . Insect Bite    Pt first noticed it last week   Subjective:    HPI   Abscess: Patient is a 66 y/o woman with history of Type II DM who presents for evaluation of a cutaneous abscess. Lesion is located in the left ankle. Onset was 1 week ago.She feels something may have bit her. She had two lesions to start and then another appeared. Symptoms have gradually worsened. Abscess has associated symptoms of itching within the past day, and swelling. Patient does have previous history of cutaneous abscesses. Patient does have diabetes.       Allergies  Allergen Reactions  . Lisinopril Cough  . Wellbutrin [Bupropion] Hives     Current Outpatient Prescriptions:  .  aspirin 81 MG tablet, Take 81 mg by mouth daily., Disp: , Rfl:  .  atorvastatin (LIPITOR) 40 MG tablet, TAKE 1 TABLET(40 MG) BY MOUTH DAILY, Disp: 90 tablet, Rfl: 3 .  CALCIUM PO, Take by mouth daily., Disp: , Rfl:  .  chlorthalidone (HYGROTON) 25 MG tablet, TAKE 1 TABLET BY MOUTH DAILY, Disp: 90 tablet, Rfl: 1 .  glucose blood test strip, Use as instructed, Disp: 100 each, Rfl: 3 .  Lancets (ACCU-CHEK MULTICLIX) lancets, Use as instructed, Disp: 100 each, Rfl: 3 .  losartan (COZAAR) 50 MG tablet, TAKE 1 TABLET BY MOUTH EVERY DAY, Disp: 90 tablet, Rfl: 3 .  metFORMIN (GLUCOPHAGE) 500 MG tablet, TAKE 1 TABLET BY MOUTH TWICE DAILY WITH A MEAL, Disp: 180 tablet, Rfl: 3 .  Multiple Vitamin (MULTIVITAMIN) tablet, Take 1 tablet by mouth daily., Disp: , Rfl:  .  omeprazole (PRILOSEC) 20 MG capsule, Take 20 mg by mouth daily., Disp: , Rfl:  .  doxycycline (VIBRA-TABS) 100 MG tablet, Take 1 tablet (100 mg total) by mouth 2 (two) times daily., Disp: 14 tablet, Rfl: 0  Review of Systems  Constitutional: Negative.   Skin: Positive for rash  and wound. Negative for color change and pallor.       Left ankle     Social History  Substance Use Topics  . Smoking status: Former Smoker    Quit date: 2008  . Smokeless tobacco: Never Used  . Alcohol use No   Objective:   BP 126/74 (BP Location: Right Arm, Patient Position: Sitting, Cuff Size: Large)   Pulse 84   Temp 99.1 F (37.3 C) (Oral)   Resp 16   Wt 199 lb (90.3 kg)   BMI 35.25 kg/m  Vitals:   03/12/17 1414  BP: 126/74  Pulse: 84  Resp: 16  Temp: 99.1 F (37.3 C)  TempSrc: Oral  Weight: 199 lb (90.3 kg)     Physical Exam  Constitutional: She is oriented to person, place, and time. She appears well-developed and well-nourished.  Neurological: She is alert and oriented to person, place, and time.  Skin: Skin is warm and dry. Rash noted. There is erythema.  There is a 1.5 cm in diameter area of induration and warmth but no fluctuance on the left medial ankle. There is an erythematous macule overlying these with 2-3 pustules. On the left lateral ankle, there are similar erythematous macules with overlying pustules, though without induration or warmth.  Psychiatric: She has a normal mood and affect. Her  behavior is normal.    Media Information     Document Information   Photos  Left foot medial  03/12/2017 14:26  Attached To:  Office Visit on 03/12/17 with Trinna Post, PA-C  Source Information   Paulene Floor  [place]  Left foot lateral   03/12/2017 14:27  Attached To:  Office Visit on 03/12/17 with Trinna Post, PA-C  Source Information   Trinna Post, PA-C  [place]:     1. Skin infection  Possibly bitten, appears to be infected. Treat as below. Please call if worsening, especially in context of diabetes.  - doxycycline (VIBRA-TABS) 100 MG tablet; Take 1 tablet (100 mg total) by mouth 2 (two) times  daily.  Dispense: 14 tablet; Refill: 0  Return if symptoms worsen or fail to improve.  The entirety of the information documented in the History of Present Illness, Review of Systems and Physical Exam were personally obtained by me. Portions of this information were initially documented by Ashley Royalty, CMA and reviewed by me for thoroughness and accuracy.          Trinna Post, PA-C  [facility]

## 2017-03-25 ENCOUNTER — Encounter: Payer: Self-pay | Admitting: Family Medicine

## 2017-04-04 DIAGNOSIS — Z23 Encounter for immunization: Secondary | ICD-10-CM | POA: Diagnosis not present

## 2017-04-08 ENCOUNTER — Ambulatory Visit (INDEPENDENT_AMBULATORY_CARE_PROVIDER_SITE_OTHER): Payer: Medicare Other | Admitting: Family Medicine

## 2017-04-08 ENCOUNTER — Encounter: Payer: Self-pay | Admitting: Family Medicine

## 2017-04-08 VITALS — BP 120/88 | HR 75 | Temp 98.7°F | Resp 16 | Wt 201.4 lb

## 2017-04-08 DIAGNOSIS — E119 Type 2 diabetes mellitus without complications: Secondary | ICD-10-CM

## 2017-04-08 DIAGNOSIS — I10 Essential (primary) hypertension: Secondary | ICD-10-CM

## 2017-04-08 LAB — POCT GLYCOSYLATED HEMOGLOBIN (HGB A1C): Hemoglobin A1C: 6.8

## 2017-04-08 MED ORDER — ACCU-CHEK MULTICLIX LANCETS MISC: 3 refills | Status: DC

## 2017-04-08 MED ORDER — GLUCOSE BLOOD VI STRP: ORAL_STRIP | 3 refills | Status: DC

## 2017-04-08 NOTE — Patient Instructions (Signed)
Continue walking program.

## 2017-04-08 NOTE — Progress Notes (Signed)
Subjective:     Patient ID: Joyce Morton, female   DOB: [birth year], 66 y.o.   MRN: [id number]  HPI  Chief Complaint  Patient presents with  . Diabetes    Patient returns to office for 3 month follow up last office visit was 12/03/16 and HgbA1C was 7.0%. Patient reports good compliance and tolerance on medication, she reports that blood sugar in morning seems to be higher and reports she is not eating properly. Patient reports fasting sugars ranging from 88-150s.  . Hyperlipidemia    3 month follow up from 12/03/16  . Hypertension    3 month follow up from 12/03/16, blood pressure at last visit was 102/70.   States she walks 20-30 minutes daily for exercise.   Review of Systems  Respiratory: Negative for shortness of breath.   Cardiovascular: Negative for chest pain and palpitations.       Objective:   Physical Exam  Constitutional: She appears well-developed and well-nourished. No distress.  Cardiovascular: Normal rate and regular rhythm.   Pulmonary/Chest: Breath sounds normal.  Musculoskeletal: She exhibits no edema (of lower extremities).       Assessment:    1. Type 2 diabetes mellitus without complication, without long-term current use of insulin ([place]): controlled - POCT glycosylated hemoglobin (Hb A1C) - glucose blood test strip; Check fasting and two hours after a meal twice weekly  Dispense: 100 each; Refill: 3 - Lancets (ACCU-CHEK MULTICLIX) lancets; Use as instructed  Dispense: 100 each; Refill: 3  2. Hypertension, essential: stable    Plan:    Continue walking program.

## 2017-04-14 ENCOUNTER — Telehealth: Payer: Self-pay | Admitting: Family Medicine

## 2017-04-14 ENCOUNTER — Other Ambulatory Visit: Payer: Self-pay | Admitting: Family Medicine

## 2017-04-14 MED ORDER — BLOOD GLUCOSE MONITOR KIT: PACK | 0 refills | Status: AC

## 2017-04-14 NOTE — Telephone Encounter (Signed)
[facility] is requesting a new Rx for Accu-Chek Aviva meter/MW

## 2017-04-14 NOTE — Telephone Encounter (Signed)
Please review. KW 

## 2017-04-25 ENCOUNTER — Other Ambulatory Visit: Payer: Self-pay | Admitting: Family Medicine

## 2017-04-29 ENCOUNTER — Other Ambulatory Visit: Payer: Self-pay | Admitting: Family Medicine

## 2017-05-19 ENCOUNTER — Other Ambulatory Visit: Payer: Self-pay | Admitting: Family Medicine

## 2017-07-09 ENCOUNTER — Ambulatory Visit: Payer: Self-pay | Admitting: Family Medicine

## 2017-07-11 ENCOUNTER — Telehealth: Payer: Self-pay | Admitting: Family Medicine

## 2017-07-11 ENCOUNTER — Other Ambulatory Visit: Payer: Self-pay | Admitting: Family Medicine

## 2017-07-11 DIAGNOSIS — E785 Hyperlipidemia, unspecified: Principal | ICD-10-CM

## 2017-07-11 DIAGNOSIS — E1169 Type 2 diabetes mellitus with other specified complication: Secondary | ICD-10-CM

## 2017-07-11 MED ORDER — ATORVASTATIN CALCIUM 40 MG PO TABS: ORAL_TABLET | ORAL | 3 refills | Status: DC

## 2017-07-11 NOTE — Telephone Encounter (Signed)
Patient is requesting refill on her atorvastatin (LIPITOR) 40 MG tablet   [facility]

## 2017-08-05 ENCOUNTER — Ambulatory Visit (INDEPENDENT_AMBULATORY_CARE_PROVIDER_SITE_OTHER): Payer: Medicare Other | Admitting: Family Medicine

## 2017-08-05 ENCOUNTER — Encounter: Payer: Self-pay | Admitting: Family Medicine

## 2017-08-05 VITALS — BP 116/90 | HR 93 | Temp 97.6°F | Resp 16 | Wt 202.2 lb

## 2017-08-05 DIAGNOSIS — Z832 Family history of diseases of the blood and blood-forming organs and certain disorders involving the immune mechanism: Secondary | ICD-10-CM | POA: Diagnosis not present

## 2017-08-05 DIAGNOSIS — E119 Type 2 diabetes mellitus without complications: Secondary | ICD-10-CM | POA: Diagnosis not present

## 2017-08-05 DIAGNOSIS — I10 Essential (primary) hypertension: Secondary | ICD-10-CM | POA: Diagnosis not present

## 2017-08-05 LAB — POCT GLYCOSYLATED HEMOGLOBIN (HGB A1C): HEMOGLOBIN A1C: 6.8

## 2017-08-05 NOTE — Progress Notes (Signed)
Subjective:     Patient ID: Joyce Morton, female   DOB: October 21, 1950, 67 y.o.   MRN: [id number] Chief Complaint  Patient presents with  . Diabetes    Patient returns to office today for 3 month follow up, last office visit was 04/08/17 condition was under control and patients HgbA1C was 6.8%. Patient denies any hyperglycemia incidents since last visit and states that she has had no changes to feet such as wounds or sores. Patient reports good compliance and tolerance on medication, she reports a poor diet but is working on improving,  . Hypertension    Follow up from 04/08/17 blood pressure at last visit in house was 120/88. Patient reports good compliance on medication.    HPI States she walks with her husband for 20 minutes [place] several x week. Just had eye exam.  Review of Systems  Constitutional:       Discussed Tdap and bone density scan-she defers today  Respiratory: Negative for shortness of breath.   Cardiovascular: Negative for chest pain and palpitations.  Neurological: Negative for dizziness.       Objective:   Physical Exam  Constitutional: She appears well-developed and well-nourished. No distress.  Cardiovascular: Normal rate and regular rhythm.  Pulmonary/Chest: Breath sounds normal.  Musculoskeletal: She exhibits no edema (of lower extremities).       Assessment:    1. Type 2 diabetes mellitus without complication, without long-term current use of insulin ([place]): stable on current medication - POCT glycosylated hemoglobin (Hb A1C)  2. Hypertension, essential: stable    Plan:    Encouraged to set up mammogram-number provided. Increase walking program to 30 minutes daily. Update labs at next office visit.

## 2017-08-05 NOTE — Patient Instructions (Addendum)
Remember to schedule a mammogram. Continue walking program and try upping to 30 minutes daily. Consider updating your tetanus shot and a bone density scan.

## 2017-08-26 ENCOUNTER — Ambulatory Visit (INDEPENDENT_AMBULATORY_CARE_PROVIDER_SITE_OTHER): Payer: Medicare Other | Admitting: Family Medicine

## 2017-08-26 ENCOUNTER — Encounter: Payer: Self-pay | Admitting: Family Medicine

## 2017-08-26 VITALS — BP 116/78 | HR 97 | Temp 99.0°F | Resp 16 | Wt 201.2 lb

## 2017-08-26 DIAGNOSIS — J069 Acute upper respiratory infection, unspecified: Secondary | ICD-10-CM

## 2017-08-26 MED ORDER — HYDROCODONE-HOMATROPINE 5-1.5 MG/5ML PO SYRP: ORAL_SOLUTION | ORAL | 0 refills | Status: DC

## 2017-08-26 NOTE — Progress Notes (Signed)
Subjective:     Patient ID: Joyce Morton, female   DOB: 17-Oct-1950, 67 y.o.   MRN: [id number] Chief Complaint  Patient presents with  . Cough    Patient comes into office today with complaints of cough and chest congestion for the past 5 days. Patient reports sore throat, left ear pain and soreness from her ear radiating down left side of neck. Patient has been using otc Mucinex for relief.    HPI No fever reported. Husband is not ill at this time.  Review of Systems     Objective:   Physical Exam  Constitutional: She appears well-developed and well-nourished. No distress.  Ears: T.M's intact without inflammation Throat: no tonsillar enlargement or exudate (sub-optimal visualization due to strong tongue reflex) Neck: left anterior cervical node. Lungs: clear     Assessment:    1. Viral upper respiratory tract infection - HYDROcodone-homatropine (HYCODAN) 5-1.5 MG/5ML syrup; 5 ml 4-6 hours as needed for cough  Dispense: 120 mL; Refill: 0    Plan:    Discussed use of Mucinex D, Delsym and natural history of her illness.

## 2017-08-26 NOTE — Patient Instructions (Addendum)
Discussed use of Mucinex D for congestion and Delsym for cough. Let me know if sinuses not improved by the end of the week.

## 2017-09-06 ENCOUNTER — Other Ambulatory Visit: Payer: Self-pay | Admitting: Family Medicine

## 2017-11-04 ENCOUNTER — Ambulatory Visit: Payer: Medicare Other | Admitting: Family Medicine

## 2017-12-02 ENCOUNTER — Other Ambulatory Visit: Payer: Self-pay | Admitting: Family Medicine

## 2017-12-02 ENCOUNTER — Ambulatory Visit: Payer: Medicare Other | Admitting: Family Medicine

## 2017-12-02 ENCOUNTER — Encounter: Payer: Self-pay | Admitting: Family Medicine

## 2017-12-02 VITALS — BP 126/82 | HR 72 | Temp 98.6°F | Resp 15 | Wt 196.4 lb

## 2017-12-02 DIAGNOSIS — I10 Essential (primary) hypertension: Secondary | ICD-10-CM

## 2017-12-02 DIAGNOSIS — E119 Type 2 diabetes mellitus without complications: Secondary | ICD-10-CM | POA: Diagnosis not present

## 2017-12-02 DIAGNOSIS — E782 Mixed hyperlipidemia: Secondary | ICD-10-CM | POA: Diagnosis not present

## 2017-12-02 LAB — POCT GLYCOSYLATED HEMOGLOBIN (HGB A1C): Hemoglobin A1C: 7.4 % — AB (ref 4.0–5.6)

## 2017-12-02 MED ORDER — METFORMIN HCL 1000 MG PO TABS: 1000.0000 mg | ORAL_TABLET | Freq: Two times a day (BID) | ORAL | 3 refills | Status: DC

## 2017-12-02 NOTE — Progress Notes (Signed)
  Subjective:     Patient ID: Joyce Morton, female   DOB: 1950/12/11, 67 y.o.   MRN: [id number] Chief Complaint  Patient presents with  . Diabetes    Patient returns to office today for follow up from 08/05/17, at last visit condition was stable and HgbA1C was 6.8%. Patient states that she is not actively checking blood sugar readings at home, she reports good compliance and tolerance on medication.  . Hypertension    Patient returns for follow up from 08/05/17, condition was stable and blood pressure at visit was 116/90. Patient reports good compliance and tolerance on medication.    HPI States her mom has dementia and is staying in a condo in [place]. She and her sister are sharing caregiver duties until they can hire other caregivers. Has not been in her usual exercise routine but weight is down 6# from prior office visit in February.  Review of Systems  Respiratory: Negative for shortness of breath.   Cardiovascular: Negative for chest pain and palpitations.       Objective:   Physical Exam  Constitutional: She appears well-developed and well-nourished. No distress.  Lungs: clear Heart: RRR without murmur Lower extremities: no edema; pedal pulses intact, sensation to monofilament intact, no wounds noted.     Assessment:    1. Type 2 diabetes mellitus without complication, without long-term current use of insulin ([place]): will increase metformin for improved control. - POCT glycosylated hemoglobin (Hb A1C)  2. Hypertension, essential: stable - Comprehensive metabolic panel  3. Mixed hyperlipidemia - Lipid panel    Plan:    Further f/u pending lab work. Reminded to schedule mammogram. Rx for Shingrix vaccine.

## 2017-12-02 NOTE — Patient Instructions (Signed)
Do schedule a mammogram. Resume your walking program daily when time allows.

## 2017-12-03 ENCOUNTER — Telehealth: Payer: Self-pay

## 2017-12-03 LAB — COMPREHENSIVE METABOLIC PANEL
ALBUMIN: 4.6 g/dL (ref 3.6–4.8)
ALK PHOS: 65 IU/L (ref 39–117)
ALT: 29 IU/L (ref 0–32)
AST: 25 IU/L (ref 0–40)
Albumin/Globulin Ratio: 1.8 (ref 1.2–2.2)
BILIRUBIN TOTAL: 0.9 mg/dL (ref 0.0–1.2)
BUN / CREAT RATIO: 20 (ref 12–28)
BUN: 16 mg/dL (ref 8–27)
CHLORIDE: 98 mmol/L (ref 96–106)
CO2: 24 mmol/L (ref 20–29)
Calcium: 10.2 mg/dL (ref 8.7–10.3)
Creatinine, Ser: 0.79 mg/dL (ref 0.57–1.00)
GFR calc Af Amer: 90 mL/min/{1.73_m2} (ref >59)
GFR calc non Af Amer: 78 mL/min/{1.73_m2} (ref >59)
Globulin, Total: 2.6 g/dL (ref 1.5–4.5)
Glucose: 143 mg/dL — ABNORMAL HIGH (ref 65–99)
Potassium: 4.1 mmol/L (ref 3.5–5.2)
Sodium: 141 mmol/L (ref 134–144)
Total Protein: 7.2 g/dL (ref 6.0–8.5)

## 2017-12-03 LAB — LIPID PANEL
Chol/HDL Ratio: 2.2 ratio (ref 0.0–4.4)
Cholesterol, Total: 129 mg/dL (ref 100–199)
HDL: 59 mg/dL (ref >39)
LDL CALC: 55 mg/dL (ref 0–99)
Triglycerides: 76 mg/dL (ref 0–149)
VLDL CHOLESTEROL CAL: 15 mg/dL (ref 5–40)

## 2017-12-03 NOTE — Telephone Encounter (Signed)
-----   Message from Carmon Ginsberg, [place] sent at 12/03/2017  7:23 AM EDT ----- Labs ok. Continue current dose of cholesterol medication and increase metformin as discussed in the office.

## 2017-12-03 NOTE — Telephone Encounter (Signed)
Patient advised.KW 

## 2017-12-10 ENCOUNTER — Other Ambulatory Visit: Payer: Self-pay | Admitting: Family Medicine

## 2018-03-04 ENCOUNTER — Ambulatory Visit: Payer: Self-pay | Admitting: Family Medicine

## 2018-03-05 ENCOUNTER — Ambulatory Visit (INDEPENDENT_AMBULATORY_CARE_PROVIDER_SITE_OTHER): Payer: Medicare Other | Admitting: Family Medicine

## 2018-03-05 ENCOUNTER — Encounter: Payer: Self-pay | Admitting: Family Medicine

## 2018-03-05 VITALS — BP 102/70 | HR 71 | Temp 97.9°F | Resp 16 | Wt 194.0 lb

## 2018-03-05 DIAGNOSIS — E119 Type 2 diabetes mellitus without complications: Secondary | ICD-10-CM | POA: Diagnosis not present

## 2018-03-05 DIAGNOSIS — Z1231 Encounter for screening mammogram for malignant neoplasm of breast: Secondary | ICD-10-CM

## 2018-03-05 DIAGNOSIS — Z1239 Encounter for other screening for malignant neoplasm of breast: Secondary | ICD-10-CM

## 2018-03-05 LAB — POCT GLYCOSYLATED HEMOGLOBIN (HGB A1C): HEMOGLOBIN A1C: 7.1 % — AB (ref 4.0–5.6)

## 2018-03-05 NOTE — Progress Notes (Signed)
  Subjective:     Patient ID: Joyce Morton, female   DOB: [birth year], 67 y.o.   MRN: [id number] Chief Complaint  Patient presents with  . Diabetes    Patient returns to office today for 3 month follow up, last offie visit was 12/02/17 HgbA1C was 7.4%. At last visit we increased Metformin for improved control, patient reports good compliance and symptom control on medicine. Patient denies polydipsia or polyuria, she denies changes to her feet for wounds or sores.   . Hypertension    Patient returns for the 3 month follow up from 12/02/17, at last visit blood pressure was 126/82. Patient reports good compliance and tolerance on medication  . Hyperlipidemia    3 month follow up from 12/02/17.    HPI States she is walking 30 minutes daily with weight down 2# from last o.v. States she has gotten the first Shingrix vaccine in July and is due the second next week.  Review of Systems  Respiratory: Negative for shortness of breath.   Cardiovascular: Negative for chest pain and palpitations.       Objective:   Physical Exam  Constitutional: She appears well-developed and well-nourished. No distress.  Cardiovascular: Normal rate and regular rhythm.  Pulmonary/Chest: Breath sounds normal.  Musculoskeletal: She exhibits no edema (of lower extremities).       Assessment:    1. Type 2 diabetes mellitus without complication, without long-term current use of insulin ([place]): improved control on higher dose of metformin - POCT glycosylated hemoglobin (Hb A1C)  2. Screening for breast cancer - MM DIGITAL SCREENING BILATERAL; Future    Plan:    Encouraged increasing her walking time and to update her mammogram.

## 2018-03-05 NOTE — Patient Instructions (Addendum)
Please call [facility] and schedule an appointment for mammogram. Do get your second Shingrix vaccine.

## 2018-03-19 ENCOUNTER — Other Ambulatory Visit: Payer: Self-pay | Admitting: Family Medicine

## 2018-03-19 ENCOUNTER — Telehealth: Payer: Self-pay | Admitting: Family Medicine

## 2018-03-19 NOTE — Telephone Encounter (Signed)
Discussed switching to Pepcid 10 mg bid then slowly taper off of 2-3 weeks and see if she still needs it.

## 2018-03-19 NOTE — Telephone Encounter (Signed)
Pt called wanting to know if she can change from zantac to another medication for reflux  She uses Joyce Morton  Pt's CB#  [phone]  Thanks Con Memos

## 2018-03-19 NOTE — Telephone Encounter (Signed)
Please advise. KW 

## 2018-06-04 ENCOUNTER — Ambulatory Visit: Payer: Self-pay | Admitting: Family Medicine

## 2018-07-07 ENCOUNTER — Ambulatory Visit: Payer: Medicare Other | Admitting: Family Medicine

## 2018-07-07 ENCOUNTER — Encounter: Payer: Self-pay | Admitting: Family Medicine

## 2018-07-07 VITALS — BP 125/70 | HR 94 | Temp 98.2°F | Wt 193.2 lb

## 2018-07-07 DIAGNOSIS — E119 Type 2 diabetes mellitus without complications: Secondary | ICD-10-CM

## 2018-07-07 DIAGNOSIS — J069 Acute upper respiratory infection, unspecified: Secondary | ICD-10-CM

## 2018-07-07 DIAGNOSIS — I10 Essential (primary) hypertension: Secondary | ICD-10-CM | POA: Diagnosis not present

## 2018-07-07 LAB — POCT GLYCOSYLATED HEMOGLOBIN (HGB A1C): Hemoglobin A1C: 7 % — AB (ref 4.0–5.6)

## 2018-07-07 NOTE — Patient Instructions (Addendum)
Discussed use of Mucinex D for congestion, Delsym for cough, and Benadryl for postnasal drainage. Do schedule a mammogram.

## 2018-07-07 NOTE — Progress Notes (Signed)
  Subjective:     Patient ID: Joyce Morton, female   DOB: 1950/10/14, 67 y.o.   MRN: [id number] Chief Complaint  Patient presents with  . Diabetes    PAtient presents today for 4 month diabetes follow-up. Good compliance and no polydipia or polyuria. Patient last A1C was 7.1.   HPI Reports onset of mild cold sx over the last two days. Reports she has not been exercising as much over the holidays.  Review of Systems  Respiratory: Negative for shortness of breath.   Cardiovascular: Negative for chest pain and palpitations.       Objective:   Physical Exam Constitutional:      General: She is not in acute distress.    Appearance: She is not ill-appearing.  Cardiovascular:     Heart sounds: Normal heart sounds.  Musculoskeletal:     Right lower leg: No edema.     Left lower leg: No edema.  Neurological:     Mental Status: She is alert.   Ears: T.M's intact without inflammation Throat: no tonsillar enlargement or exudate Neck: no cervical adenopathy Lungs: clear     Assessment:    1. Type 2 diabetes mellitus without complication, without long-term current use of insulin ([place]): controlled - POCT glycosylated hemoglobin (Hb A1C)  2. URI, acute  3. Hypertension, essential: stable    Plan:    Discussed use of Mucinex D for congestion, Delsym for cough, and Benadryl for postnasal drainage. Encouraged to schedule mammogram.

## 2018-07-26 ENCOUNTER — Other Ambulatory Visit: Payer: Self-pay | Admitting: Family Medicine

## 2018-07-26 DIAGNOSIS — E1169 Type 2 diabetes mellitus with other specified complication: Secondary | ICD-10-CM

## 2018-07-26 DIAGNOSIS — E785 Hyperlipidemia, unspecified: Principal | ICD-10-CM

## 2018-10-05 ENCOUNTER — Telehealth: Payer: Self-pay | Admitting: Physician Assistant

## 2018-10-05 DIAGNOSIS — I10 Essential (primary) hypertension: Secondary | ICD-10-CM

## 2018-10-05 NOTE — Telephone Encounter (Signed)
[facility] faxed refill request for the following medications:  chlorthalidone (HYGROTON) 25 MG tablet   Pt was seeing [name] but is scheduled to see Adriana 12/03/2018. Please advise. Thanks TNP

## 2018-10-06 ENCOUNTER — Ambulatory Visit: Payer: Medicare Other | Admitting: Physician Assistant

## 2018-10-06 ENCOUNTER — Ambulatory Visit: Payer: Self-pay | Admitting: Family Medicine

## 2018-10-06 MED ORDER — CHLORTHALIDONE 25 MG PO TABS: 25.0000 mg | ORAL_TABLET | Freq: Every day | ORAL | 0 refills | Status: DC

## 2018-10-07 ENCOUNTER — Telehealth: Payer: Self-pay | Admitting: Physician Assistant

## 2018-10-07 DIAGNOSIS — I10 Essential (primary) hypertension: Secondary | ICD-10-CM

## 2018-10-07 MED ORDER — LOSARTAN POTASSIUM 50 MG PO TABS: 50.0000 mg | ORAL_TABLET | Freq: Every day | ORAL | 0 refills | Status: DC

## 2018-10-07 NOTE — Telephone Encounter (Signed)
Patient called office wanting to inquire about refill request she states that she is about to be out of medication and would like this filled today. KW

## 2018-10-07 NOTE — Telephone Encounter (Signed)
Sent Rx 

## 2018-10-07 NOTE — Telephone Encounter (Signed)
[facility] faxed refill request for the following medications:  losartan (COZAAR) 50 MG tablet  90 day supply  LOV: 07/07/2018 with [name]. Pt was seeing [name] and is scheduled to see [facility] June 2020. Please advise. Thanks TNP

## 2018-10-07 NOTE — Telephone Encounter (Signed)
Please Review

## 2018-12-02 NOTE — Progress Notes (Signed)
Patient: Joyce Morton Female    DOB: 08-24-1950   68 y.o.   MRN: [id number] Visit Date: 12/03/2018  Today's Provider: Trinna Post, PA-C   Chief Complaint  Patient presents with  . Diabetes   Subjective:   Previously saw Mariel Sleet, PA-C who has since retired. Living in [place], with husband of 78 years, has one son and 3 grandchildren. Currently not working.   HPI  Diabetes Mellitus Type II, Follow-up:   Lab Results  Component Value Date   HGBA1C 6.3 (A) 12/03/2018   HGBA1C 7.0 (A) 07/07/2018   HGBA1C 7.1 (A) 03/05/2018   Has had diabetes for 10 years. Currently taking metformin 1000 mg BID.  Last seen for diabetes 5 months ago.  Management since then includes A1C. She reports excellent compliance with treatment. She is not having side effects.  Current symptoms include none and have been stable. Home blood sugar records: fasting range: 120-150  [facility] appointment on 12/15/2018. She gets this once a year.  Episodes of hypoglycemia? no   Current Insulin Regimen: none Most Recent Eye Exam:UTD Weight trend: stable Prior visit with dietician: No Current exercise: housecleaning Current diet habits: not asked  Pertinent Labs:    Component Value Date/Time   CHOL 129 12/02/2017 0859   TRIG 76 12/02/2017 0859   HDL 59 12/02/2017 0859   LDLCALC 55 12/02/2017 0859   CREATININE 0.79 12/02/2017 0859    Wt Readings from Last 3 Encounters:  12/03/18 191 lb 3.2 oz (86.7 kg)  07/07/18 193 lb 3.2 oz (87.6 kg)  03/05/18 194 lb (88 kg)   HTN: Losartan 50 mg daily and chlorthalidone 25 mg daily. Taking these without issue.   HLD: Currently taking Lipitor 40 mg daily.   Lipid Panel     Component Value Date/Time   CHOL 129 12/02/2017 0859   TRIG 76 12/02/2017 0859   HDL 59 12/02/2017 0859   CHOLHDL 2.2 12/02/2017 0859   LDLCALC 55 12/02/2017 0859    ------------------------------------------------------------------------  Allergies  Allergen  Reactions  . Lisinopril Cough  . Wellbutrin [Bupropion] Hives     Current Outpatient Medications:  .  aspirin 81 MG tablet, Take 81 mg by mouth daily., Disp: , Rfl:  .  atorvastatin (LIPITOR) 40 MG tablet, TAKE 1 TABLET(40 MG) BY MOUTH DAILY, Disp: 90 tablet, Rfl: 3 .  blood glucose meter kit and supplies KIT, Dispense based on patient and insurance preference. Use 2-4 x weekly., Disp: 1 each, Rfl: 0 .  CALCIUM PO, Take by mouth daily., Disp: , Rfl:  .  chlorthalidone (HYGROTON) 25 MG tablet, Take 1 tablet (25 mg total) by mouth daily., Disp: 90 tablet, Rfl: 0 .  glucose blood test strip, Check fasting and two hours after a meal twice weekly, Disp: 100 each, Rfl: 3 .  Lancets (ACCU-CHEK MULTICLIX) lancets, Use as instructed, Disp: 100 each, Rfl: 3 .  losartan (COZAAR) 50 MG tablet, Take 1 tablet (50 mg total) by mouth daily., Disp: 90 tablet, Rfl: 0 .  metFORMIN (GLUCOPHAGE) 1000 MG tablet, Take 1 tablet (1,000 mg total) by mouth 2 (two) times daily with a meal., Disp: 180 tablet, Rfl: 3 .  Multiple Vitamin (MULTIVITAMIN) tablet, Take 1 tablet by mouth daily., Disp: , Rfl:  .  ranitidine (ZANTAC) 150 MG capsule, Take 150 mg by mouth 2 (two) times daily., Disp: , Rfl:   Review of Systems  Constitutional: Negative.   Eyes: Negative.   Respiratory: Negative.  Cardiovascular: Negative.   Endocrine: Negative.     Social History   Tobacco Use  . Smoking status: Former Smoker    Last attempt to quit: 2008    Years since quitting: 12.4  . Smokeless tobacco: Never Used  Substance Use Topics  . Alcohol use: No      Objective:   BP 129/85 (BP Location: Right Arm, Patient Position: Sitting, Cuff Size: Large)   Pulse 85   Temp 98.5 F (36.9 C) (Oral)   Resp 16   Ht 5' 4"  (1.626 m)   Wt 191 lb 3.2 oz (86.7 kg)   SpO2 97%   BMI 32.82 kg/m  Vitals:   12/03/18 0817  BP: 129/85  Pulse: 85  Resp: 16  Temp: 98.5 F (36.9 C)  TempSrc: Oral  SpO2: 97%  Weight: 191 lb 3.2 oz  (86.7 kg)  Height: 5' 4"  (1.626 m)     Physical Exam Constitutional:      Appearance: Normal appearance.  Cardiovascular:     Rate and Rhythm: Normal rate and regular rhythm.     Heart sounds: Normal heart sounds.  Pulmonary:     Effort: Pulmonary effort is normal.     Breath sounds: Normal breath sounds.  Skin:    General: Skin is warm and dry.  Neurological:     Mental Status: She is alert and oriented to person, place, and time. Mental status is at baseline.  Psychiatric:        Mood and Affect: Mood normal.        Behavior: Behavior normal.     Diabetic Foot Exam - Simple   Simple Foot Form Diabetic Foot exam was performed with the following findings:  Yes 12/03/2018  8:28 AM  Visual Inspection No deformities, no ulcerations, no other skin breakdown bilaterally:  Yes Sensation Testing Intact to touch and monofilament testing bilaterally:  Yes Pulse Check Posterior Tibialis and Dorsalis pulse intact bilaterally:  Yes Comments        Assessment & Plan    1. Type 2 diabetes mellitus without complication, without long-term current use of insulin (HCC)  A1c has decreased today, currently well controlled. Continue metformin 1000 mg BID. Eye exam scheduled with [facility] at the end of this month, requested patient to have records faxed to [place]. Currently on ARB and statin. Follow up 3-6 months with virtual visit and drive by E1E.  - POCT glycosylated hemoglobin (Hb A1C) - Comprehensive Metabolic Panel (CMET) - Lipid Profile - CBC with Differential  2. Hyperlipidemia associated with type 2 diabetes mellitus (HCC)  Continue Lipitor 40 mg QD, will check lipid panel.  3. Encounter for hepatitis C screening test for low risk patient  - Hepatitis C antibody  4. Breast cancer screening  Patient unaware mammogram was ordered. She has not had mammogram since 2001.  - MM Digital Screening; Future  5. Hypertension associated with diabetes ([place])  Well controlled,  continue Losartan 50 mg daily and chlorthalidone 25 mg daily. Cmet as above.  The entirety of the information documented in the History of Present Illness, Review of Systems and Physical Exam were personally obtained by me. Portions of this information were initially documented by Lynford Humphrey, CMA and reviewed by me for thoroughness and accuracy.        Trinna Post, PA-C  [facility]

## 2018-12-03 ENCOUNTER — Encounter: Payer: Self-pay | Admitting: Physician Assistant

## 2018-12-03 ENCOUNTER — Other Ambulatory Visit: Payer: Self-pay

## 2018-12-03 ENCOUNTER — Ambulatory Visit: Payer: Medicare Other | Admitting: Physician Assistant

## 2018-12-03 VITALS — BP 129/85 | HR 85 | Temp 98.5°F | Resp 16 | Ht 64.0 in | Wt 191.2 lb

## 2018-12-03 DIAGNOSIS — E1159 Type 2 diabetes mellitus with other circulatory complications: Secondary | ICD-10-CM

## 2018-12-03 DIAGNOSIS — Z1159 Encounter for screening for other viral diseases: Secondary | ICD-10-CM

## 2018-12-03 DIAGNOSIS — E785 Hyperlipidemia, unspecified: Secondary | ICD-10-CM

## 2018-12-03 DIAGNOSIS — Z1239 Encounter for other screening for malignant neoplasm of breast: Secondary | ICD-10-CM | POA: Diagnosis not present

## 2018-12-03 DIAGNOSIS — E1169 Type 2 diabetes mellitus with other specified complication: Secondary | ICD-10-CM | POA: Diagnosis not present

## 2018-12-03 DIAGNOSIS — E119 Type 2 diabetes mellitus without complications: Secondary | ICD-10-CM

## 2018-12-03 DIAGNOSIS — I152 Hypertension secondary to endocrine disorders: Secondary | ICD-10-CM

## 2018-12-03 DIAGNOSIS — I10 Essential (primary) hypertension: Secondary | ICD-10-CM

## 2018-12-03 LAB — POCT GLYCOSYLATED HEMOGLOBIN (HGB A1C)
Est. average glucose Bld gHb Est-mCnc: 134
Hemoglobin A1C: 6.3 % — AB (ref 4.0–5.6)

## 2018-12-03 NOTE — Patient Instructions (Signed)
Diabetes Mellitus and Exercise Exercising regularly is important for your overall health, especially when you have diabetes (diabetes mellitus). Exercising is not only about losing weight. It has many other health benefits, such as increasing muscle strength and bone density and reducing body fat and stress. This leads to improved fitness, flexibility, and endurance, all of which result in better overall health. Exercise has additional benefits for people with diabetes, including:  Reducing appetite.  Helping to lower and control blood glucose.  Lowering blood pressure.  Helping to control amounts of fatty substances (lipids) in the blood, such as cholesterol and triglycerides.  Helping the body to respond better to insulin (improving insulin sensitivity).  Reducing how much insulin the body needs.  Decreasing the risk for heart disease by: ? Lowering cholesterol and triglyceride levels. ? Increasing the levels of good cholesterol. ? Lowering blood glucose levels. What is my activity plan? Your health care provider or certified diabetes educator can help you make a plan for the type and frequency of exercise (activity plan) that works for you. Make sure that you:  Do at least 150 minutes of moderate-intensity or vigorous-intensity exercise each week. This could be brisk walking, biking, or water aerobics. ? Do stretching and strength exercises, such as yoga or weightlifting, at least 2 times a week. ? Spread out your activity over at least 3 days of the week.  Get some form of physical activity every day. ? Do not go more than 2 days in a row without some kind of physical activity. ? Avoid being inactive for more than 30 minutes at a time. Take frequent breaks to walk or stretch.  Choose a type of exercise or activity that you enjoy, and set realistic goals.  Start slowly, and gradually increase the intensity of your exercise over time. What do I need to know about managing my  diabetes?   Check your blood glucose before and after exercising. ? If your blood glucose is 240 mg/dL (13.3 mmol/L) or higher before you exercise, check your urine for ketones. If you have ketones in your urine, do not exercise until your blood glucose returns to normal. ? If your blood glucose is 100 mg/dL (5.6 mmol/L) or lower, eat a snack containing 15-20 grams of carbohydrate. Check your blood glucose 15 minutes after the snack to make sure that your level is above 100 mg/dL (5.6 mmol/L) before you start your exercise.  Know the symptoms of low blood glucose (hypoglycemia) and how to treat it. Your risk for hypoglycemia increases during and after exercise. Common symptoms of hypoglycemia can include: ? Hunger. ? Anxiety. ? Sweating and feeling clammy. ? Confusion. ? Dizziness or feeling light-headed. ? Increased heart rate or palpitations. ? Blurry vision. ? Tingling or numbness around the mouth, lips, or tongue. ? Tremors or shakes. ? Irritability.  Keep a rapid-acting carbohydrate snack available before, during, and after exercise to help prevent or treat hypoglycemia.  Avoid injecting insulin into areas of the body that are going to be exercised. For example, avoid injecting insulin into: ? The arms, when playing tennis. ? The legs, when jogging.  Keep records of your exercise habits. Doing this can help you and your health care provider adjust your diabetes management plan as needed. Write down: ? Food that you eat before and after you exercise. ? Blood glucose levels before and after you exercise. ? The type and amount of exercise you have done. ? When your insulin is expected to peak, if you use   insulin. Avoid exercising at times when your insulin is peaking.  When you start a new exercise or activity, work with your health care provider to make sure the activity is safe for you, and to adjust your insulin, medicines, or food intake as needed.  Drink plenty of water while  you exercise to prevent dehydration or heat stroke. Drink enough fluid to keep your urine clear or pale yellow. Summary  Exercising regularly is important for your overall health, especially when you have diabetes (diabetes mellitus).  Exercising has many health benefits, such as increasing muscle strength and bone density and reducing body fat and stress.  Your health care provider or certified diabetes educator can help you make a plan for the type and frequency of exercise (activity plan) that works for you.  When you start a new exercise or activity, work with your health care provider to make sure the activity is safe for you, and to adjust your insulin, medicines, or food intake as needed. This information is not intended to replace advice given to you by your health care provider. Make sure you discuss any questions you have with your health care provider. Document Released: 09/07/2003 Document Revised: 12/26/2016 Document Reviewed: 11/27/2015 Elsevier Interactive Patient Education  2019 Elsevier Inc[employer]  

## 2018-12-04 ENCOUNTER — Telehealth: Payer: Self-pay

## 2018-12-04 LAB — COMPREHENSIVE METABOLIC PANEL
ALT: 25 IU/L (ref 0–32)
AST: 22 IU/L (ref 0–40)
Albumin/Globulin Ratio: 2.2 (ref 1.2–2.2)
Albumin: 4.9 g/dL — ABNORMAL HIGH (ref 3.8–4.8)
Alkaline Phosphatase: 59 IU/L (ref 39–117)
BUN/Creatinine Ratio: 32 — ABNORMAL HIGH (ref 12–28)
BUN: 23 mg/dL (ref 8–27)
Bilirubin Total: 0.9 mg/dL (ref 0.0–1.2)
CO2: 22 mmol/L (ref 20–29)
Calcium: 10.1 mg/dL (ref 8.7–10.3)
Chloride: 97 mmol/L (ref 96–106)
Creatinine, Ser: 0.71 mg/dL (ref 0.57–1.00)
GFR calc Af Amer: 101 mL/min/{1.73_m2} (ref >59)
GFR calc non Af Amer: 88 mL/min/{1.73_m2} (ref >59)
Globulin, Total: 2.2 g/dL (ref 1.5–4.5)
Glucose: 138 mg/dL — ABNORMAL HIGH (ref 65–99)
Potassium: 3.7 mmol/L (ref 3.5–5.2)
Sodium: 138 mmol/L (ref 134–144)
Total Protein: 7.1 g/dL (ref 6.0–8.5)

## 2018-12-04 LAB — LIPID PANEL
Chol/HDL Ratio: 2 ratio (ref 0.0–4.4)
Cholesterol, Total: 122 mg/dL (ref 100–199)
HDL: 60 mg/dL (ref >39)
LDL Calculated: 47 mg/dL (ref 0–99)
Triglycerides: 75 mg/dL (ref 0–149)
VLDL Cholesterol Cal: 15 mg/dL (ref 5–40)

## 2018-12-04 LAB — CBC WITH DIFFERENTIAL/PLATELET
Basophils Absolute: 0.1 10*3/uL (ref 0.0–0.2)
Basos: 1 %
EOS (ABSOLUTE): 0.3 10*3/uL (ref 0.0–0.4)
Eos: 4 %
Hematocrit: 35.3 % (ref 34.0–46.6)
Hemoglobin: 11.4 g/dL (ref 11.1–15.9)
Immature Grans (Abs): 0 10*3/uL (ref 0.0–0.1)
Immature Granulocytes: 1 %
Lymphocytes Absolute: 2.2 10*3/uL (ref 0.7–3.1)
Lymphs: 29 %
MCH: 19.1 pg — ABNORMAL LOW (ref 26.6–33.0)
MCHC: 32.3 g/dL (ref 31.5–35.7)
MCV: 59 fL — ABNORMAL LOW (ref 79–97)
Monocytes Absolute: 0.5 10*3/uL (ref 0.1–0.9)
Monocytes: 6 %
Neutrophils Absolute: 4.6 10*3/uL (ref 1.4–7.0)
Neutrophils: 59 %
Platelets: 401 10*3/uL (ref 150–450)
RBC: 5.98 x10E6/uL — ABNORMAL HIGH (ref 3.77–5.28)
RDW: 17.3 % — ABNORMAL HIGH (ref 11.7–15.4)
WBC: 7.7 10*3/uL (ref 3.4–10.8)

## 2018-12-04 LAB — HEPATITIS C ANTIBODY: Hep C Virus Ab: <0.1 s/co ratio (ref 0.0–0.9)

## 2018-12-04 NOTE — Telephone Encounter (Signed)
-----   Message from Trinna Post, [place] sent at 12/04/2018 11:26 AM EDT ----- Can we please add on Iron + TIBC + Fer under dx microcytosis?

## 2018-12-04 NOTE — Telephone Encounter (Signed)
Called [facility]

## 2018-12-09 LAB — IRON,TIBC AND FERRITIN PANEL
Ferritin: 164 ng/mL — ABNORMAL HIGH (ref 15–150)
Iron Saturation: 17 % (ref 15–55)
Iron: 62 ug/dL (ref 27–139)
Total Iron Binding Capacity: 356 ug/dL (ref 250–450)
UIBC: 294 ug/dL (ref 118–369)

## 2018-12-09 LAB — SPECIMEN STATUS REPORT

## 2018-12-11 ENCOUNTER — Telehealth: Payer: Self-pay | Admitting: *Deleted

## 2018-12-11 ENCOUNTER — Telehealth: Payer: Self-pay

## 2018-12-11 DIAGNOSIS — R718 Other abnormality of red blood cells: Secondary | ICD-10-CM

## 2018-12-11 NOTE — Telephone Encounter (Signed)
-----   Message from Trinna Post, [place] sent at 12/11/2018  3:47 PM EDT ----- Her A1c was controlled and her labs looked normal except her blood count which showed small red blood cells. Sometimes this is caused by iron deficiency but her iron is normal. I don't have any previous comparison for her blood counts so I recommend repeating the CBC in 3 months, lab only visit. Can we add that under dx microcytosis?

## 2018-12-11 NOTE — Telephone Encounter (Signed)
Patient was advised and lab slip left up front.

## 2018-12-11 NOTE — Telephone Encounter (Signed)
Patient called office requesting lab results from 12/03/2018. Please advise?

## 2018-12-11 NOTE — Telephone Encounter (Signed)
See result note.  

## 2018-12-14 ENCOUNTER — Other Ambulatory Visit: Payer: Self-pay

## 2018-12-14 MED ORDER — METFORMIN HCL 1000 MG PO TABS: 1000.0000 mg | ORAL_TABLET | Freq: Two times a day (BID) | ORAL | 1 refills | Status: DC

## 2018-12-14 NOTE — Telephone Encounter (Signed)
Patient was advised via different message.

## 2018-12-15 LAB — HM DIABETES EYE EXAM

## 2019-01-05 ENCOUNTER — Other Ambulatory Visit: Payer: Self-pay | Admitting: Physician Assistant

## 2019-01-05 DIAGNOSIS — I10 Essential (primary) hypertension: Secondary | ICD-10-CM

## 2019-01-05 MED ORDER — LOSARTAN POTASSIUM 50 MG PO TABS: 50.0000 mg | ORAL_TABLET | Freq: Every day | ORAL | 0 refills | Status: DC

## 2019-01-05 NOTE — Telephone Encounter (Signed)
[facility] faxed refill request for the following medications:  losartan (COZAAR) 50 MG tablet   Please advise.  

## 2019-01-16 LAB — CBC WITH DIFFERENTIAL/PLATELET
Basophils Absolute: 0.1 10*3/uL (ref 0.0–0.2)
Basos: 1 %
EOS (ABSOLUTE): 0.3 10*3/uL (ref 0.0–0.4)
Eos: 3 %
Hematocrit: 36.6 % (ref 34.0–46.6)
Hemoglobin: 11.5 g/dL (ref 11.1–15.9)
Immature Grans (Abs): 0.1 10*3/uL (ref 0.0–0.1)
Immature Granulocytes: 1 %
Lymphocytes Absolute: 2.5 10*3/uL (ref 0.7–3.1)
Lymphs: 29 %
MCH: 19 pg — ABNORMAL LOW (ref 26.6–33.0)
MCHC: 31.4 g/dL — ABNORMAL LOW (ref 31.5–35.7)
MCV: 60 fL — ABNORMAL LOW (ref 79–97)
Monocytes Absolute: 0.5 10*3/uL (ref 0.1–0.9)
Monocytes: 6 %
Neutrophils Absolute: 5.2 10*3/uL (ref 1.4–7.0)
Neutrophils: 60 %
Platelets: 433 10*3/uL (ref 150–450)
RBC: 6.06 x10E6/uL — ABNORMAL HIGH (ref 3.77–5.28)
RDW: 17.9 % — ABNORMAL HIGH (ref 11.7–15.4)
WBC: 8.6 10*3/uL (ref 3.4–10.8)

## 2019-01-18 ENCOUNTER — Telehealth: Payer: Self-pay

## 2019-01-18 NOTE — Telephone Encounter (Signed)
Patient notified of lab results

## 2019-01-18 NOTE — Telephone Encounter (Signed)
-----   Message from Trinna Post, [place] sent at 01/18/2019  2:26 PM EDT ----- CBC stable. 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 Continue with routine follow ups.

## 2019-01-21 ENCOUNTER — Other Ambulatory Visit: Payer: Self-pay | Admitting: Physician Assistant

## 2019-01-21 DIAGNOSIS — I10 Essential (primary) hypertension: Secondary | ICD-10-CM

## 2019-01-25 ENCOUNTER — Encounter: Payer: Self-pay | Admitting: Physician Assistant

## 2019-01-25 ENCOUNTER — Other Ambulatory Visit: Payer: Self-pay

## 2019-01-25 ENCOUNTER — Ambulatory Visit (INDEPENDENT_AMBULATORY_CARE_PROVIDER_SITE_OTHER): Payer: Medicare Other | Admitting: Physician Assistant

## 2019-01-25 VITALS — BP 130/80 | HR 82 | Temp 98.8°F | Wt 191.6 lb

## 2019-01-25 DIAGNOSIS — S86312A Strain of muscle(s) and tendon(s) of peroneal muscle group at lower leg level, left leg, initial encounter: Secondary | ICD-10-CM | POA: Diagnosis not present

## 2019-01-25 MED ORDER — MELOXICAM 7.5 MG PO TABS: 7.5000 mg | ORAL_TABLET | Freq: Every day | ORAL | 0 refills | Status: DC

## 2019-01-25 NOTE — Patient Instructions (Signed)
[facility] peroneal tendinopathy rehab for stretches and exercises   Peroneal Tendinopathy  Peroneal tendinopathy is irritation of the tendons that pass behind your ankle (peroneal tendons). These tendons attach muscles in your foot to a bone on the side of your foot and underneath the arch of your foot. This condition can cause your peroneal tendons to get bigger and swell. What are the causes? This condition may be caused by:  Putting stress on your ankle over and over again (overuse injury).  A sudden injury that puts stress on your tendons, such as an ankle sprain. What increases the risk? You are more likely to develop this condition if you:  Have high arches.  Play sports that involve putting stress on the ankle over and over again. These sports include: ? Running. ? Dancing. ? Soccer. ? Basketball. What are the signs or symptoms? Symptoms of this condition can start suddenly or develop gradually. Symptoms of this condition include:  Pain in the back of the ankle, on the side of the foot, or in the arch of the foot.  Pain that gets worse with activity and better with rest.  Swelling.  Warmth.  Weakness in your foot or ankle. How is this diagnosed? This condition may be diagnosed based on:  Your symptoms.  Your medical history.  A physical exam. During the exam, your health care provider may move your foot and ankle and test the strength of your leg muscles.  Imaging tests, such as: ? X-rays or a CT scan to check for bone injury. ? MRI or ultrasound to check for muscle or tendon injury. How is this treated? This condition may be treated by:  Keeping your body weight off your ankle for several days.  Returning to full activity gradually.  Putting ice on your ankle to reduce swelling.  Taking NSAIDs, such as ibuprofen.  Having medicine injected into your tendon to reduce swelling.  Wearing a removable boot or brace for ankle support.  Doing range-of-motion  exercises and strengthening exercises (physical therapy) when pain and swelling improve. If the condition does not improve with treatment, or if a tendon or muscle is damaged, surgery may be needed. Follow these instructions at home: If you have a boot or brace:  Wear the boot or brace as told by your health care provider. Remove it only as told by your health care provider.  Loosen the boot or brace if your toes tingle, become numb, or turn cold and blue.  Keep the boot or brace clean.  If the boot or brace is not waterproof: ? Do not let it get wet. ? Cover it with a watertight covering when you take a bath or shower. Managing pain, stiffness, and swelling   If directed, put ice on the injured area. ? If you have a removable boot or brace, remove it as told by your health care provider. ? Put ice in a plastic bag. ? Place a towel between your skin and the bag. ? Leave the ice on for 20 minutes, 2-3 times a day.  Move your toes often to reduce stiffness and swelling.  Raise (elevate) your ankle above the level of your heart while you are sitting or lying down. Activity  Do not do activities that make pain or swelling worse.  Do exercises as told by your health care provider.  Return to your normal activities as told by your health care provider. Ask your health care provider what activities are safe for you.  Ask  your health care provider when it is safe to drive if you have a boot or brace on your foot. General instructions  Take over-the-counter and prescription medicines only as told by your health care provider.  Do not use any products that contain nicotine or tobacco, such as cigarettes, e-cigarettes, and chewing tobacco. These can delay healing. If you need help quitting, ask your health care provider.  Keep all follow-up visits as told by your health care provider. This is important. How is this prevented?  Wear supportive footwear that is appropriate for your  athletic activity.  Avoid athletic activities that cause swelling or pain in your ankle or foot.  See your health care provider if you have pain or swelling that does not improve after a few days of rest.  Stop training if you develop pain or swelling.  If you start a new athletic activity, start gradually to build up your strength, endurance, and flexibility.  Warm up and stretch before being active.  Cool down and stretch after being active. Contact a health care provider if:  Your symptoms get worse.  Your symptoms do not improve in 2-4 weeks.  You develop new, unexplained symptoms. Summary  Peroneal tendinopathy is irritation of the tendons that pass behind your ankle.  This condition is caused by overuse or sudden injury to the peroneal tendon.  Symptoms include pain, swelling, warmth, and weakness in your foot or ankle.  This condition is treated with rest, ice, medicines, physical therapy, and surgery if needed. This information is not intended to replace advice given to you by your health care provider. Make sure you discuss any questions you have with your health care provider. Document Released: 06/17/2005 Document Revised: 10/08/2018 Document Reviewed: 07/27/2018 Elsevier Patient Education  2020 [employer].

## 2019-01-25 NOTE — Progress Notes (Signed)
Patient: Joyce Morton Female    DOB: Nov 23, 1950   68 y.o.   MRN: [id number] Visit Date: 01/25/2019  Today's Provider: Mar Daring, PA-C   Chief Complaint  Patient presents with  . Leg Pain   Subjective:     Leg Pain  The incident occurred 5 to 7 days ago. There was no injury mechanism. The pain is present in the right leg and right knee. The quality of the pain is described as aching. The pain is at a severity of 5/10. The pain is moderate. Pertinent negatives include no inability to bear weight, numbness or tingling. She reports no foreign bodies present. The symptoms are aggravated by movement and weight bearing. She has tried acetaminophen for the symptoms. The treatment provided mild relief.   Patient does report that pain started after helping her husband move furniture, then last week they took the car to have some work done and had to stand at [employer] for an hour or so. Noticed pain worsening after that.   Pain starts on the lateral side of the knee just over the fibular head and radiates down into the muscle belly of the lateral lower extremity musculature. Pain is described as a dull ache. No numbness, tingling, burning. Pain does not radiate all the way to the ankle, comes just above the lateral malleolus. No skin rash.  Allergies  Allergen Reactions  . Lisinopril Cough  . Wellbutrin [Bupropion] Hives     Current Outpatient Medications:  .  aspirin 81 MG tablet, Take 81 mg by mouth daily., Disp: , Rfl:  .  atorvastatin (LIPITOR) 40 MG tablet, TAKE 1 TABLET(40 MG) BY MOUTH DAILY, Disp: 90 tablet, Rfl: 3 .  blood glucose meter kit and supplies KIT, Dispense based on patient and insurance preference. Use 2-4 x weekly., Disp: 1 each, Rfl: 0 .  CALCIUM PO, Take by mouth daily., Disp: , Rfl:  .  chlorthalidone (HYGROTON) 25 MG tablet, TAKE 1 TABLET(25 MG) BY MOUTH DAILY, Disp: 90 tablet, Rfl: 0 .  glucose blood test strip, Check fasting and two  hours after a meal twice weekly, Disp: 100 each, Rfl: 3 .  Lancets (ACCU-CHEK MULTICLIX) lancets, Use as instructed, Disp: 100 each, Rfl: 3 .  losartan (COZAAR) 50 MG tablet, Take 1 tablet (50 mg total) by mouth daily., Disp: 90 tablet, Rfl: 0 .  metFORMIN (GLUCOPHAGE) 1000 MG tablet, Take 1 tablet (1,000 mg total) by mouth 2 (two) times daily with a meal., Disp: 180 tablet, Rfl: 1 .  Multiple Vitamin (MULTIVITAMIN) tablet, Take 1 tablet by mouth daily., Disp: , Rfl:  .  ranitidine (ZANTAC) 150 MG capsule, Take 150 mg by mouth 2 (two) times daily., Disp: , Rfl:   Review of Systems  Constitutional: Negative.   Respiratory: Negative.   Cardiovascular: Negative.   Gastrointestinal: Negative.   Musculoskeletal: Positive for arthralgias and myalgias.  Skin: Negative for rash.  Neurological: Negative for tingling, weakness and numbness.    Social History   Tobacco Use  . Smoking status: Former Smoker    Quit date: 2008    Years since quitting: 12.5  . Smokeless tobacco: Never Used  Substance Use Topics  . Alcohol use: No      Objective:   BP 130/80 (BP Location: Right Arm, Patient Position: Sitting, Cuff Size: Normal)   Pulse 82   Temp 98.8 F (37.1 C) (Oral)   Wt 191 lb 9.6 oz (86.9 kg)  SpO2 95%   BMI 32.89 kg/m  Vitals:   01/25/19 1131  BP: 130/80  Pulse: 82  Temp: 98.8 F (37.1 C)  TempSrc: Oral  SpO2: 95%  Weight: 191 lb 9.6 oz (86.9 kg)     Physical Exam Vitals signs reviewed.  Constitutional:      General: She is not in acute distress.    Appearance: Normal appearance. She is well-developed. She is obese. She is not ill-appearing or diaphoretic.  Neck:     Musculoskeletal: Normal range of motion and neck supple.  Cardiovascular:     Rate and Rhythm: Normal rate and regular rhythm.     Heart sounds: Normal heart sounds. No murmur. No friction rub. No gallop.   Pulmonary:     Effort: Pulmonary effort is normal. No respiratory distress.     Breath sounds:  Normal breath sounds. No wheezing or rales.  Musculoskeletal:     Right knee: She exhibits bony tenderness (fibular head). She exhibits normal range of motion, no swelling, no effusion, no erythema, normal alignment, no LCL laxity, normal patellar mobility, normal meniscus and no MCL laxity. No tenderness found.       Legs:  Neurological:     Mental Status: She is alert.      No results found for any visits on 01/25/19.     Assessment & Plan    1. Strain of muscle(s) and tendon(s) of peroneal muscle group at lower leg level, left leg, initial encounter Suspect peroneal muscle strain. Will use meloxicam as below. Advised to heat and stretch. Discussed wearing shoes with good arch support (patient had on flip flops today) to prevent pronation of the ankle and straining the peroneals more. Discussed epsom salt soaks as well. Call if not improving and may consider medrol dose pak and possibly PT.  - meloxicam (MOBIC) 7.5 MG tablet; Take 1 tablet (7.5 mg total) by mouth daily.  Dispense: 15 tablet; Refill: 0     Mar Daring, PA-C  [facility]

## 2019-02-02 ENCOUNTER — Ambulatory Visit: Payer: Self-pay | Admitting: Physician Assistant

## 2019-03-07 ENCOUNTER — Ambulatory Visit: Payer: Medicare Other

## 2019-03-07 ENCOUNTER — Ambulatory Visit
Admission: EM | Admit: 2019-03-07 | Discharge: 2019-03-07 | Disposition: A | Discharge status: Home / Self Care | Payer: Medicare Other | Attending: Emergency Medicine | Admitting: Emergency Medicine

## 2019-03-07 DIAGNOSIS — M25561 Pain in right knee: Secondary | ICD-10-CM | POA: Insufficient documentation

## 2019-03-07 DIAGNOSIS — M1120 Other chondrocalcinosis, unspecified site: Secondary | ICD-10-CM | POA: Diagnosis present

## 2019-03-07 MED ORDER — PREDNISONE 10 MG PO TABS: ORAL_TABLET | ORAL | 0 refills | Status: DC

## 2019-03-07 NOTE — ED Triage Notes (Signed)
Pt here for right leg pain that feels "in the bone" on the right side of the knee for 1 month and was seen by her pcp and was given anti inflammatory and helped her and now has gradually returned. Hurts to get up stairs and the pain is radiating down the right side of her leg. Feels as if it's stiff, she is walking with a Limp. Did try heat with no relief and extra strength tylenol.

## 2019-03-07 NOTE — ED Provider Notes (Signed)
[facility] ____________________________________________  Time seen: Approximately 9:48 AM  I have reviewed the triage vital signs and the nursing notes.   HISTORY  Chief Complaint Leg Pain  HPI Joyce Morton is a 68 y.o. female presenting for evaluation of right lateral knee pain present for approximately 1 month.  States was seen by her primary care for the same complaint approximately 1 month ago was treated with Mobic anti-inflammatory for 2 weeks, and states that this really did help the pain, but reports since stopping pain has increased again.  Pain never fully resolved.  States pain is described as a deep stiffness, particularly worse in the morning.  States pain stays to the lateral knee but with walking she intermittently feels the pain radiating down to the lateral aspect side of her shin.  No paresthesias.  Denies decreased range of motion.  Denies increase activity.  Has continued to remain active and ambulatory.  Denies swelling.  No skin changes or insect bites.  Denies decreased range of motion. Denies clicking, pooping or giving way. No recent cough, congestion, chest pain, shortness of breath, fevers or sickness.  Has applied heat which did not help.  Denies other aggravating or alleviating factors.  No history of same.  Trinna Post, PA-C: PCP   Past Medical History:  Diagnosis Date  . Anxiety   . Diabetes mellitus without complication ([place])   . GERD (gastroesophageal reflux disease)   . Hyperlipidemia   . Hypertension   . Vertigo    only a few brief episodes at night    Patient Active Problem List   Diagnosis Date Noted  . Family history of thalassemia 08/05/2017  . Special screening for malignant neoplasms, colon   . Benign neoplasm of cecum   . Polyp of sigmoid colon   . Benign neoplasm of transverse colon   . Hyperlipidemia 05/02/2015  . Diabetes mellitus, type II ([place]) 05/02/2015  . Hypertension, essential 05/02/2015    Past  Surgical History:  Procedure Laterality Date  . COLONOSCOPY WITH PROPOFOL N/A 01/16/2017   Procedure: COLONOSCOPY WITH PROPOFOL;  Surgeon: Lucilla Lame, MD;  Location: [facility];  Service: Endoscopy;  Laterality: N/A;  Diabetic - oral meds  . NO PAST SURGERIES    . POLYPECTOMY N/A 01/16/2017   Procedure: POLYPECTOMY;  Surgeon: Lucilla Lame, MD;  Location: [facility];  Service: Endoscopy;  Laterality: N/A;     No current facility-administered medications for this encounter.   Current Outpatient Medications:  .  aspirin 81 MG tablet, Take 81 mg by mouth daily., Disp: , Rfl:  .  atorvastatin (LIPITOR) 40 MG tablet, TAKE 1 TABLET(40 MG) BY MOUTH DAILY, Disp: 90 tablet, Rfl: 3 .  CALCIUM PO, Take by mouth daily., Disp: , Rfl:  .  chlorthalidone (HYGROTON) 25 MG tablet, TAKE 1 TABLET(25 MG) BY MOUTH DAILY, Disp: 90 tablet, Rfl: 0 .  losartan (COZAAR) 50 MG tablet, Take 1 tablet (50 mg total) by mouth daily., Disp: 90 tablet, Rfl: 0 .  metFORMIN (GLUCOPHAGE) 1000 MG tablet, Take 1 tablet (1,000 mg total) by mouth 2 (two) times daily with a meal., Disp: 180 tablet, Rfl: 1 .  blood glucose meter kit and supplies KIT, Dispense based on patient and insurance preference. Use 2-4 x weekly., Disp: 1 each, Rfl: 0 .  glucose blood test strip, Check fasting and two hours after a meal twice weekly, Disp: 100 each, Rfl: 3 .  Lancets (ACCU-CHEK MULTICLIX) lancets, Use as instructed, Disp: 100  each, Rfl: 3 .  meloxicam (MOBIC) 7.5 MG tablet, Take 1 tablet (7.5 mg total) by mouth daily., Disp: 15 tablet, Rfl: 0 .  Multiple Vitamin (MULTIVITAMIN) tablet, Take 1 tablet by mouth daily., Disp: , Rfl:  .  predniSONE (DELTASONE) 10 MG tablet, Start 60 mg po day one, then 50 mg po day two, taper by 10 mg daily until complete., Disp: 21 tablet, Rfl: 0 .  ranitidine (ZANTAC) 150 MG capsule, Take 150 mg by mouth 2 (two) times daily., Disp: , Rfl:   Allergies Lisinopril and Wellbutrin [bupropion]   History reviewed. No pertinent family history.  Social History Social History   Tobacco Use  . Smoking status: Former Smoker    Quit date: 2008    Years since quitting: 12.6  . Smokeless tobacco: Never Used  Substance Use Topics  . Alcohol use: No  . Drug use: No    Review of Systems Constitutional: No fever ENT: No sore throat. Cardiovascular: Denies chest pain. Respiratory: Denies shortness of breath. Gastrointestinal: No abdominal pain.  Musculoskeletal: Positive right leg pain.  Skin: Negative for rash. Neurological: Negative for focal weakness or numbness.   ____________________________________________   PHYSICAL EXAM:  VITAL SIGNS: ED Triage Vitals  Enc Vitals Group     BP 03/07/19 0920 (!) 142/83     Pulse Rate 03/07/19 0920 (!) 111     Resp 03/07/19 0920 18     Temp 03/07/19 0920 98.5 F (36.9 C)     Temp Source 03/07/19 0920 Oral     SpO2 03/07/19 0920 98 %     Weight 03/07/19 0921 189 lb (85.7 kg)     Height --      Head Circumference --      Peak Flow --      Pain Score 03/07/19 0921 0     Pain Loc --      Pain Edu? --      Excl. in [place]? --    Vitals:   03/07/19 0920 03/07/19 0921 03/07/19 0953  BP: (!) 142/83  (!) 145/89  Pulse: (!) 111  (!) 106  Resp: 18    Temp: 98.5 F (36.9 C)    TempSrc: Oral    SpO2: 98%  98%  Weight:  189 lb (85.7 kg)     Constitutional: Alert and oriented. Well appearing and in no acute distress. Eyes: Conjunctivae are normal.  ENT      Head: Normocephalic and atraumatic. Cardiovascular: Normal rate, regular rhythm. Grossly normal heart sounds.  Good peripheral circulation. Respiratory: Normal respiratory effort without tachypnea nor retractions. Breath sounds are clear and equal bilaterally. No wheezes, rales, rhonchi. Musculoskeletal:  Bilateral pedal pulses equal and easily palpated. Except: Right lateral knee mild diffuse tenderness to palpation as well as tenderness at proximal fibular head, pain with full  knee extension, no pain with flexion, minimal pain with medial and lateral stress, no pain with anterior posterior drawer test, no swelling, no ecchymosis, skin intact, full range of motion present.  Right lower extremity otherwise nontender.  Negative Homans sign.  No distal edema.  Pulses distally equal and intact. Neurologic:  Normal speech and language. No gross focal neurologic deficits are appreciated. Speech is normal. No gait instability.  Skin:  Skin is warm, dry and intact. No rash noted. Psychiatric: Mood and affect are normal. Speech and behavior are normal. Patient exhibits appropriate insight and judgment   ___________________________________________   LABS (all labs ordered are listed, but only abnormal results are  displayed)  Labs Reviewed - No data to display  RADIOLOGY  Dg Knee Complete 4 Views Right  Result Date: 03/07/2019 CLINICAL DATA:  Acute on chronic right knee pain.  No known injury. EXAM: RIGHT KNEE - COMPLETE 4+ VIEW COMPARISON:  None. FINDINGS: No evidence of fracture, dislocation, or joint effusion. No evidence of arthropathy or other focal bone abnormality. Chondrocalcinosis of the menisci noted. IMPRESSION: No acute abnormality. Chondrocalcinosis. Electronically Signed   By: Inge Rise M.D.   On: 03/07/2019 10:14   ____________________________________________   PROCEDURES Procedures   INITIAL IMPRESSION / ASSESSMENT AND PLAN / ED COURSE  Pertinent labs & imaging results that were available during my care of the patient were reviewed by me and considered in my medical decision making (see chart for details).  Overall well-appearing patient.  No acute distress.  Right lateral knee pain present for the last month improved with anti-inflammatories.  No injury.  Pain to lateral knee.  Discussed multiple differentials with patient.  Felt less likely to be DVT as pain reproducible directly to lateral knee, no edema and distal pulses intact, counseled  regarding follow-up with primary care. Right knee x-ray as above per radiologist, no acute abnormality, chondrocalcinosis of the menisci noted.  Encouraged ice, stretching and will treat with prednisone. Discussed needing to follow-up with orthopedic for continued complaints as well.Discussed indication, risks and benefits of medications with patient.  Discussed strict reevaluation including swelling or increased pain sooner.  Discussed follow up with Primary care physician this week. Discussed follow up and return parameters including no resolution or any worsening concerns. Patient verbalized understanding and agreed to plan.   ____________________________________________   FINAL CLINICAL IMPRESSION(S) / ED DIAGNOSES  Final diagnoses:  Acute pain of right knee  Chondrocalcinosis     ED Discharge Orders         Ordered    predniSONE (DELTASONE) 10 MG tablet     03/07/19 1026           Note: This dictation was prepared with Dragon dictation along with smaller phrase technology. Any transcriptional errors that result from this process are unintentional.         Marylene Land, NP 03/07/19 1057

## 2019-03-07 NOTE — Discharge Instructions (Addendum)
Take medication as prescribed. Rest. Drink plenty of fluids. Ice. Monitor as discussed.   Follow up with your primary care physician this week.  He may need to follow-up with orthopedic if pain continues.  Return to Urgent care for new or worsening concerns.

## 2019-03-09 NOTE — Progress Notes (Signed)
Patient: Joyce Morton Female    DOB: [birth year]   68 y.o.   MRN: [id number] Visit Date: 03/10/2019  Today's Provider: Mar Daring, PA-C   Chief Complaint  Patient presents with  . Knee Pain   Subjective:     Knee Pain  There was no injury mechanism. The pain is present in the right knee. The pain has been constant since onset. Associated symptoms include an inability to bear weight and a loss of motion. Pertinent negatives include no loss of sensation, muscle weakness, numbness or tingling. The symptoms are aggravated by weight bearing and palpation. She has tried NSAIDs (Prednisone) for the symptoms. The treatment provided moderate relief.    Allergies  Allergen Reactions  . Lisinopril Cough  . Wellbutrin [Bupropion] Hives     Current Outpatient Medications:  .  aspirin 81 MG tablet, Take 81 mg by mouth daily., Disp: , Rfl:  .  atorvastatin (LIPITOR) 40 MG tablet, TAKE 1 TABLET(40 MG) BY MOUTH DAILY, Disp: 90 tablet, Rfl: 3 .  blood glucose meter kit and supplies KIT, Dispense based on patient and insurance preference. Use 2-4 x weekly., Disp: 1 each, Rfl: 0 .  CALCIUM PO, Take by mouth daily., Disp: , Rfl:  .  chlorthalidone (HYGROTON) 25 MG tablet, TAKE 1 TABLET(25 MG) BY MOUTH DAILY, Disp: 90 tablet, Rfl: 0 .  famotidine (PEPCID) 10 MG tablet, Take 10 mg by mouth daily., Disp: , Rfl:  .  glucose blood test strip, Check fasting and two hours after a meal twice weekly, Disp: 100 each, Rfl: 3 .  Lancets (ACCU-CHEK MULTICLIX) lancets, Use as instructed, Disp: 100 each, Rfl: 3 .  losartan (COZAAR) 50 MG tablet, Take 1 tablet (50 mg total) by mouth daily., Disp: 90 tablet, Rfl: 0 .  metFORMIN (GLUCOPHAGE) 1000 MG tablet, Take 1 tablet (1,000 mg total) by mouth 2 (two) times daily with a meal., Disp: 180 tablet, Rfl: 1 .  Multiple Vitamin (MULTIVITAMIN) tablet, Take 1 tablet by mouth daily., Disp: , Rfl:  .  predniSONE (DELTASONE) 10 MG tablet, Start 60 mg po day  one, then 50 mg po day two, taper by 10 mg daily until complete., Disp: 21 tablet, Rfl: 0 .  meloxicam (MOBIC) 7.5 MG tablet, Take 1 tablet (7.5 mg total) by mouth daily. (Patient not taking: Reported on 03/10/2019), Disp: 15 tablet, Rfl: 0 .  ranitidine (ZANTAC) 150 MG capsule, Take 150 mg by mouth 2 (two) times daily., Disp: , Rfl:   Review of Systems  Constitutional: Negative for appetite change, chills, fatigue and fever.  Respiratory: Negative for chest tightness and shortness of breath.   Cardiovascular: Negative for chest pain and palpitations.  Gastrointestinal: Negative for abdominal pain, nausea and vomiting.  Musculoskeletal: Positive for arthralgias (right knee) and gait problem. Negative for back pain, joint swelling, myalgias, neck pain and neck stiffness.  Neurological: Negative for dizziness, tingling, weakness and numbness.    Social History   Tobacco Use  . Smoking status: Former Smoker    Quit date: 2008    Years since quitting: 12.6  . Smokeless tobacco: Never Used  Substance Use Topics  . Alcohol use: No      Objective:   BP (!) 152/94 (BP Location: Left Arm, Patient Position: Sitting, Cuff Size: Large)   Pulse 99   Temp (!) 97.1 F (36.2 C) (Temporal)   Wt 192 lb (87.1 kg)   BMI 32.96 kg/m  Vitals:   03/10/19 5003  BP: (!) 152/94  Pulse: 99  Temp: (!) 97.1 F (36.2 C)  TempSrc: Temporal  Weight: 192 lb (87.1 kg)  Body mass index is 32.96 kg/m.   Physical Exam Vitals signs reviewed.  Constitutional:      Appearance: Normal appearance. She is well-developed. She is obese.  HENT:     Head: Normocephalic and atraumatic.  Neck:     Musculoskeletal: Normal range of motion and neck supple.  Pulmonary:     Effort: Pulmonary effort is normal. No respiratory distress.  Neurological:     Mental Status: She is alert.  Psychiatric:        Mood and Affect: Mood normal.        Behavior: Behavior normal.        Thought Content: Thought content normal.         Judgment: Judgment normal.     CLINICAL DATA:  Acute on chronic right knee pain.  No known injury.  EXAM: RIGHT KNEE - COMPLETE 4+ VIEW  COMPARISON:  None.  FINDINGS: No evidence of fracture, dislocation, or joint effusion. No evidence of arthropathy or other focal bone abnormality. Chondrocalcinosis of the menisci noted.  IMPRESSION: No acute abnormality.  Chondrocalcinosis.   Electronically Signed   By: Inge Rise M.D.   On: 03/07/2019 10:14 No results found for any visits on 03/10/19.     Assessment & Plan    1. Primary osteoarthritis of right knee Currently on prednisone from urgent care and has 3 days left. Currently improving some. Will send in meloxicam for her to start once prednisone completed. Try using compression brace. Referral to ortho placed below. Patient request Dr. Rudene Christians. Call if worsening before seen. I will see her back in 3 months for her CPE.  - Ambulatory referral to Orthopedic Surgery - meloxicam (MOBIC) 7.5 MG tablet; Take 1 tablet (7.5 mg total) by mouth daily.  Dispense: 15 tablet; Refill: 0     Mar Daring, PA-C  [facility]

## 2019-03-10 ENCOUNTER — Ambulatory Visit (INDEPENDENT_AMBULATORY_CARE_PROVIDER_SITE_OTHER): Payer: Medicare Other | Admitting: Physician Assistant

## 2019-03-10 ENCOUNTER — Other Ambulatory Visit: Payer: Self-pay

## 2019-03-10 ENCOUNTER — Encounter: Payer: Self-pay | Admitting: Physician Assistant

## 2019-03-10 VITALS — BP 152/94 | HR 99 | Temp 97.1°F | Wt 192.0 lb

## 2019-03-10 DIAGNOSIS — M1711 Unilateral primary osteoarthritis, right knee: Secondary | ICD-10-CM

## 2019-03-10 MED ORDER — MELOXICAM 7.5 MG PO TABS: 7.5000 mg | ORAL_TABLET | Freq: Every day | ORAL | 0 refills | Status: DC

## 2019-03-11 ENCOUNTER — Telehealth: Payer: Self-pay | Admitting: Physician Assistant

## 2019-03-11 NOTE — Telephone Encounter (Signed)
2 weeks preferably

## 2019-03-11 NOTE — Telephone Encounter (Signed)
Pt called wanting to know how long she has to be off of prednisone before she can have a flu vaccine  CB#  [phone]  teri

## 2019-03-11 NOTE — Telephone Encounter (Signed)
Patient was advised.  

## 2019-03-11 NOTE — Telephone Encounter (Signed)
Please advise 

## 2019-06-02 NOTE — Progress Notes (Signed)
Patient: Joyce Morton, Female    DOB: April 30, 1951, 68 y.o.   MRN: [id number] Visit Date: 06/03/2019  Today's Provider: Mar Daring, PA-C   Chief Complaint  Patient presents with  . Annual Exam   Subjective:    I,Joyce Morton,RMA am acting as a Education administrator for [employer], PA-C.   Annual wellness visit Joyce Morton is a 68 y.o. female. She feels well. She reports exercising. She reports she is sleeping well. ----------------------------------------------------------- Colonoscopy:01/16/2017 Mammogram:ordered 12/03/2018  Review of Systems  Constitutional: Positive for diaphoresis.       Having little energy and feeling tired  HENT: Negative.   Eyes: Negative.   Respiratory: Negative.   Cardiovascular: Negative.   Gastrointestinal: Negative.   Endocrine: Negative.   Genitourinary: Negative.   Musculoskeletal: Negative.   Skin: Negative.   Allergic/Immunologic: Negative.   Neurological: Negative.   Hematological: Negative.   Psychiatric/Behavioral: Negative.     Social History   Socioeconomic History  . Marital status: Married    Spouse name: Not on file  . Number of children: Not on file  . Years of education: Not on file  . Highest education level: Not on file  Occupational History  . Not on file  Social Needs  . Financial resource strain: Not on file  . Food insecurity    Worry: Not on file    Inability: Not on file  . Transportation needs    Medical: Not on file    Non-medical: Not on file  Tobacco Use  . Smoking status: Former Smoker    Quit date: 2008    Years since quitting: 12.9  . Smokeless tobacco: Never Used  Substance and Sexual Activity  . Alcohol use: No  . Drug use: No  . Sexual activity: Not on file  Lifestyle  . Physical activity    Days per week: Not on file    Minutes per session: Not on file  . Stress: Not on file  Relationships  . Social Herbalist on phone: Not on file    Gets  together: Not on file    Attends religious service: Not on file    Active member of club or organization: Not on file    Attends meetings of clubs or organizations: Not on file    Relationship status: Not on file  . Intimate partner violence    Fear of current or ex partner: Not on file    Emotionally abused: Not on file    Physically abused: Not on file    Forced sexual activity: Not on file  Other Topics Concern  . Not on file  Social History Narrative  . Not on file    Past Medical History:  Diagnosis Date  . Anxiety   . Diabetes mellitus without complication ([place])   . GERD (gastroesophageal reflux disease)   . Hyperlipidemia   . Hypertension   . Vertigo    only a few brief episodes at night     Patient Active Problem List   Diagnosis Date Noted  . Family history of thalassemia 08/05/2017  . Special screening for malignant neoplasms, colon   . Benign neoplasm of cecum   . Polyp of sigmoid colon   . Benign neoplasm of transverse colon   . Hyperlipidemia 05/02/2015  . Diabetes mellitus, type II ([place]) 05/02/2015  . Hypertension, essential 05/02/2015    Past Surgical History:  Procedure Laterality Date  . COLONOSCOPY  WITH PROPOFOL N/A 01/16/2017   Procedure: COLONOSCOPY WITH PROPOFOL;  Surgeon: Lucilla Lame, MD;  Location: [facility];  Service: Endoscopy;  Laterality: N/A;  Diabetic - oral meds  . NO PAST SURGERIES    . POLYPECTOMY N/A 01/16/2017   Procedure: POLYPECTOMY;  Surgeon: Lucilla Lame, MD;  Location: [facility];  Service: Endoscopy;  Laterality: N/A;    Her family history is not on file.   Current Outpatient Medications:  .  aspirin 81 MG tablet, Take 81 mg by mouth daily., Disp: , Rfl:  .  atorvastatin (LIPITOR) 40 MG tablet, TAKE 1 TABLET(40 MG) BY MOUTH DAILY, Disp: 90 tablet, Rfl: 3 .  blood glucose meter kit and supplies KIT, Dispense based on patient and insurance preference. Use 2-4 x weekly., Disp: 1 each, Rfl: 0 .  CALCIUM PO,  Take by mouth daily., Disp: , Rfl:  .  chlorthalidone (HYGROTON) 25 MG tablet, TAKE 1 TABLET(25 MG) BY MOUTH DAILY, Disp: 90 tablet, Rfl: 0 .  famotidine (PEPCID) 10 MG tablet, Take 10 mg by mouth daily., Disp: , Rfl:  .  glucose blood test strip, Check fasting and two hours after a meal twice weekly, Disp: 100 each, Rfl: 3 .  Lancets (ACCU-CHEK MULTICLIX) lancets, Use as instructed, Disp: 100 each, Rfl: 3 .  losartan (COZAAR) 50 MG tablet, Take 1 tablet (50 mg total) by mouth daily., Disp: 90 tablet, Rfl: 0 .  meloxicam (MOBIC) 7.5 MG tablet, Take 1 tablet (7.5 mg total) by mouth daily., Disp: 15 tablet, Rfl: 0 .  metFORMIN (GLUCOPHAGE) 1000 MG tablet, Take 1 tablet (1,000 mg total) by mouth 2 (two) times daily with a meal., Disp: 180 tablet, Rfl: 1 .  Multiple Vitamin (MULTIVITAMIN) tablet, Take 1 tablet by mouth daily., Disp: , Rfl:   Patient Care Team: Mar Daring, PA-C as PCP - General (Family Medicine)    Objective:    Vitals: BP (!) 149/90 (BP Location: Left Arm, Patient Position: Sitting, Cuff Size: Large)   Pulse 92   Temp (!) 97.5 F (36.4 C) (Temporal)   Resp 16   Ht _0  (1.626 m)   Wt 192 lb 12.8 oz (87.5 kg)   BMI 33.09 kg/m   Physical Exam Vitals signs reviewed.  Constitutional:      General: She is not in acute distress.    Appearance: Normal appearance. She is well-developed. She is obese. She is not ill-appearing or diaphoretic.  HENT:     Head: Normocephalic and atraumatic.     Right Ear: Tympanic membrane, ear canal and external ear normal.     Left Ear: Tympanic membrane, ear canal and external ear normal.     Nose: Nose normal.  Eyes:     General: No scleral icterus.       Right eye: No discharge.        Left eye: No discharge.     Extraocular Movements: Extraocular movements intact.     Conjunctiva/sclera: Conjunctivae normal.     Pupils: Pupils are equal, round, and reactive to light.  Neck:     Musculoskeletal: Normal range of motion and  neck supple.     Thyroid: No thyromegaly.     Vascular: No carotid bruit or JVD.     Trachea: No tracheal deviation.  Cardiovascular:     Rate and Rhythm: Normal rate and regular rhythm.     Pulses: Normal pulses.     Heart sounds: Normal heart sounds. No murmur. No friction rub.  No gallop.   Pulmonary:     Effort: Pulmonary effort is normal. No respiratory distress.     Breath sounds: Normal breath sounds. No wheezing or rales.  Chest:     Chest wall: No tenderness.     Breasts:        Right: Normal. No mass.        Left: Normal. No mass.  Abdominal:     General: Abdomen is flat. Bowel sounds are normal. There is no distension.     Palpations: Abdomen is soft. There is no mass.     Tenderness: There is no abdominal tenderness. There is no guarding or rebound.  Musculoskeletal: Normal range of motion.        General: No tenderness.     Right lower leg: No edema.     Left lower leg: No edema.  Lymphadenopathy:     Cervical: No cervical adenopathy.     Upper Body:     Right upper body: No supraclavicular, axillary or pectoral adenopathy.     Left upper body: No supraclavicular, axillary or pectoral adenopathy.  Skin:    General: Skin is warm and dry.     Capillary Refill: Capillary refill takes less than 2 seconds.     Findings: No rash.  Neurological:     General: No focal deficit present.     Mental Status: She is alert and oriented to person, place, and time. Mental status is at baseline.  Psychiatric:        Mood and Affect: Mood normal.        Behavior: Behavior normal.        Thought Content: Thought content normal.        Judgment: Judgment normal.     Activities of Daily Living In your present state of health, do you have any difficulty performing the following activities: 06/03/2019  Hearing? N  Vision? N  Difficulty concentrating or making decisions? N  Walking or climbing stairs? N  Dressing or bathing? N  Doing errands, shopping? N  Some recent data might  be hidden    Fall Risk Assessment Fall Risk  03/05/2018 12/03/2016  Falls in the past year? No Yes  Number falls in past yr: - 1  Injury with Fall? - Yes  Follow up - Falls evaluation completed;Follow up appointment     Depression Screen PHQ 2/9 Scores 06/03/2019 12/03/2016  PHQ - 2 Score 1 0    No flowsheet data found.    Assessment & Plan:     Annual Wellness Visit  Reviewed patient's Family Medical History Reviewed and updated list of patient's medical providers Assessment of cognitive impairment was done Assessed patient's functional ability Established a written schedule for health screening [place] Completed and Reviewed  Exercise Activities and Dietary recommendations Goals   None     Immunization History  Administered Date(s) Administered  . Influenza, High Dose Seasonal PF 04/07/2018  . Influenza-Unspecified 04/15/2015, 04/04/2017  . Pneumococcal Conjugate-13 05/29/2015  . Pneumococcal Polysaccharide-23 12/03/2016  . Zoster Recombinat (Shingrix) 01/08/2018, 03/19/2018    Health Maintenance  Topic Date Due  . Samul Dada  01/03/1969  . MAMMOGRAM  09/04/2001  . DEXA SCAN  01/04/2015  . INFLUENZA VACCINE  01/30/2019  . HEMOGLOBIN A1C  06/04/2019  . FOOT EXAM  12/03/2019  . OPHTHALMOLOGY EXAM  12/15/2019  . COLONOSCOPY  01/16/2022  . Hepatitis C Screening  Completed  . PNA vac Low Risk Adult  Completed  Discussed health benefits of physical activity, and encouraged her to engage in regular exercise appropriate for her age and condition.    1. Annual physical exam Normal physical exam today. Will check labs as below and f/u pending lab results. If labs are stable and WNL she will not need to have these rechecked for one year at her next annual physical exam. She is to call the office in the meantime if she has any acute issue, questions or concerns. - CBC w/Diff - Comprehensive Metabolic Panel (CMET) - Lipid Profile - TSH -  HgB A1c  2. Encounter for breast cancer screening using non-mammogram modality Breast exam today was normal. There is no family history of breast cancer. She does perform regular self breast exams. Mammogram was ordered as below. Information for [facility] clinic was given to patient so she may schedule her mammogram at her convenience.  3. Hypertension, essential Elevated today. Increase losartan to 175m as below. Continue Chlorthalidone 27m Will check labs as below and f/u pending results. I will see her back in 3 months.  - CBC w/Diff - Comprehensive Metabolic Panel (CMET) - Lipid Profile - TSH - HgB A1c - losartan (COZAAR) 100 MG tablet; Take 1 tablet (100 mg total) by mouth daily.  Dispense: 90 tablet; Refill: 1  4. Type 2 diabetes mellitus without complication, without long-term current use of insulin (HCC) Stable. Continue Metformin 100056mID. Will check labs as below and f/u pending results. Return in 3 months.  - CBC w/Diff - Comprehensive Metabolic Panel (CMET) - Lipid Profile - TSH - HgB A1c  5. Pure hypercholesterolemia Stable. Continue Atorvastatin 51m74mill check labs as below and f/u pending results. - CBC w/Diff - Comprehensive Metabolic Panel (CMET) - Lipid Profile - TSH - HgB A1c  6. Class 1 obesity due to excess calories with serious comorbidity and body mass index (BMI) of 33.0 to 33.9 in adult Counseled patient on healthy lifestyle modifications including dieting and exercise.   7. Intertrigo Under breast. Nystatin given as below for prn use.  - nystatin cream (MYCOSTATIN); Apply 1 application topically 2 (two) times daily.  Dispense: 30 g; Refill: 0  ------------------------------------------------------------------------------------------------------------    JennMar Daring-C  [facility]

## 2019-06-03 ENCOUNTER — Ambulatory Visit: Payer: Medicare Other | Admitting: Physician Assistant

## 2019-06-03 ENCOUNTER — Other Ambulatory Visit: Payer: Self-pay

## 2019-06-03 ENCOUNTER — Encounter: Payer: Self-pay | Admitting: Physician Assistant

## 2019-06-03 ENCOUNTER — Ambulatory Visit (INDEPENDENT_AMBULATORY_CARE_PROVIDER_SITE_OTHER): Payer: Medicare Other | Admitting: Physician Assistant

## 2019-06-03 VITALS — BP 149/90 | HR 92 | Temp 97.5°F | Resp 16 | Ht 64.0 in | Wt 192.8 lb

## 2019-06-03 DIAGNOSIS — E6609 Other obesity due to excess calories: Secondary | ICD-10-CM

## 2019-06-03 DIAGNOSIS — E119 Type 2 diabetes mellitus without complications: Secondary | ICD-10-CM

## 2019-06-03 DIAGNOSIS — L304 Erythema intertrigo: Secondary | ICD-10-CM

## 2019-06-03 DIAGNOSIS — Z6833 Body mass index (BMI) 33.0-33.9, adult: Secondary | ICD-10-CM

## 2019-06-03 DIAGNOSIS — Z1239 Encounter for other screening for malignant neoplasm of breast: Secondary | ICD-10-CM

## 2019-06-03 DIAGNOSIS — E7800 Pure hypercholesterolemia, unspecified: Secondary | ICD-10-CM | POA: Diagnosis not present

## 2019-06-03 DIAGNOSIS — Z0000 Encounter for general adult medical examination without abnormal findings: Secondary | ICD-10-CM | POA: Diagnosis not present

## 2019-06-03 DIAGNOSIS — I10 Essential (primary) hypertension: Secondary | ICD-10-CM | POA: Diagnosis not present

## 2019-06-03 MED ORDER — NYSTATIN 100000 UNIT/GM EX CREA: 1.0000 "application " | TOPICAL_CREAM | Freq: Two times a day (BID) | CUTANEOUS | 0 refills | Status: DC

## 2019-06-03 MED ORDER — LOSARTAN POTASSIUM 100 MG PO TABS: 100.0000 mg | ORAL_TABLET | Freq: Every day | ORAL | 1 refills | Status: DC

## 2019-06-03 NOTE — Patient Instructions (Signed)
Health Maintenance After Age 69 After age 69, you are at a higher risk for certain long-term diseases and infections as well as injuries from falls. Falls are a major cause of broken bones and head injuries in people who are older than age 69. Getting regular preventive care can help to keep you healthy and well. Preventive care includes getting regular testing and making lifestyle changes as recommended by your health care provider. Talk with your health care provider about:  Which screenings and tests you should have. A screening is a test that checks for a disease when you have no symptoms.  A diet and exercise plan that is right for you. What should I know about screenings and tests to prevent falls? Screening and testing are the best ways to find a health problem early. Early diagnosis and treatment give you the best chance of managing medical conditions that are common after age 69. Certain conditions and lifestyle choices may make you more likely to have a fall. Your health care provider may recommend:  Regular vision checks. Poor vision and conditions such as cataracts can make you more likely to have a fall. If you wear glasses, make sure to get your prescription updated if your vision changes.  Medicine review. Work with your health care provider to regularly review all of the medicines you are taking, including over-the-counter medicines. Ask your health care provider about any side effects that may make you more likely to have a fall. Tell your health care provider if any medicines that you take make you feel dizzy or sleepy.  Osteoporosis screening. Osteoporosis is a condition that causes the bones to get weaker. This can make the bones weak and cause them to break more easily.  Blood pressure screening. Blood pressure changes and medicines to control blood pressure can make you feel dizzy.  Strength and balance checks. Your health care provider may recommend certain tests to check your  strength and balance while standing, walking, or changing positions.  Foot health exam. Foot pain and numbness, as well as not wearing proper footwear, can make you more likely to have a fall.  Depression screening. You may be more likely to have a fall if you have a fear of falling, feel emotionally low, or feel unable to do activities that you used to do.  Alcohol use screening. Using too much alcohol can affect your balance and may make you more likely to have a fall. What actions can I take to lower my risk of falls? General instructions  Talk with your health care provider about your risks for falling. Tell your health care provider if: ? You fall. Be sure to tell your health care provider about all falls, even ones that seem minor. ? You feel dizzy, sleepy, or off-balance.  Take over-the-counter and prescription medicines only as told by your health care provider. These include any supplements.  Eat a healthy diet and maintain a healthy weight. A healthy diet includes low-fat dairy products, low-fat (lean) meats, and fiber from whole grains, beans, and lots of fruits and vegetables. Home safety  Remove any tripping hazards, such as rugs, cords, and clutter.  Install safety equipment such as grab bars in bathrooms and safety rails on stairs.  Keep rooms and walkways well-lit. Activity   Follow a regular exercise program to stay fit. This will help you maintain your balance. Ask your health care provider what types of exercise are appropriate for you.  If you need a cane or   walker, use it as recommended by your health care provider.  Wear supportive shoes that have nonskid soles. Lifestyle  Do not drink alcohol if your health care provider tells you not to drink.  If you drink alcohol, limit how much you have: ? 0-1 drink a day for women. ? 0-2 drinks a day for men.  Be aware of how much alcohol is in your drink. In the [place] one drink equals one typical bottle of beer (12  oz), one-half glass of wine (5 oz), or one shot of hard liquor (1 oz).  Do not use any products that contain nicotine or tobacco, such as cigarettes and e-cigarettes. If you need help quitting, ask your health care provider. Summary  Having a healthy lifestyle and getting preventive care can help to protect your health and wellness after age 69.  Screening and testing are the best way to find a health problem early and help you avoid having a fall. Early diagnosis and treatment give you the best chance for managing medical conditions that are more common for people who are older than age 69.  Falls are a major cause of broken bones and head injuries in people who are older than age 69. Take precautions to prevent a fall at home.  Work with your health care provider to learn what changes you can make to improve your health and wellness and to prevent falls. This information is not intended to replace advice given to you by your health care provider. Make sure you discuss any questions you have with your health care provider. Document Released: 04/30/2017 Document Revised: 10/08/2018 Document Reviewed: 04/30/2017 Elsevier Patient Education  2020 [employer]vier Inc.  

## 2019-06-04 ENCOUNTER — Other Ambulatory Visit: Payer: Self-pay | Admitting: Physician Assistant

## 2019-06-04 ENCOUNTER — Telehealth: Payer: Self-pay

## 2019-06-04 LAB — CBC WITH DIFFERENTIAL/PLATELET
Basophils Absolute: 0.1 10*3/uL (ref 0.0–0.2)
Basos: 1 %
EOS (ABSOLUTE): 0.2 10*3/uL (ref 0.0–0.4)
Eos: 3 %
Hematocrit: 36.8 % (ref 34.0–46.6)
Hemoglobin: 11.4 g/dL (ref 11.1–15.9)
Immature Grans (Abs): 0.1 10*3/uL (ref 0.0–0.1)
Immature Granulocytes: 1 %
Lymphocytes Absolute: 2.5 10*3/uL (ref 0.7–3.1)
Lymphs: 34 %
MCH: 19.1 pg — ABNORMAL LOW (ref 26.6–33.0)
MCHC: 31 g/dL — ABNORMAL LOW (ref 31.5–35.7)
MCV: 62 fL — ABNORMAL LOW (ref 79–97)
Monocytes Absolute: 0.5 10*3/uL (ref 0.1–0.9)
Monocytes: 6 %
Neutrophils Absolute: 4.1 10*3/uL (ref 1.4–7.0)
Neutrophils: 55 %
Platelets: 444 10*3/uL (ref 150–450)
RBC: 5.97 x10E6/uL — ABNORMAL HIGH (ref 3.77–5.28)
RDW: 17.9 % — ABNORMAL HIGH (ref 11.7–15.4)
WBC: 7.4 10*3/uL (ref 3.4–10.8)

## 2019-06-04 LAB — COMPREHENSIVE METABOLIC PANEL
ALT: 38 IU/L — ABNORMAL HIGH (ref 0–32)
AST: 28 IU/L (ref 0–40)
Albumin/Globulin Ratio: 1.6 (ref 1.2–2.2)
Albumin: 4.6 g/dL (ref 3.8–4.8)
Alkaline Phosphatase: 65 IU/L (ref 39–117)
BUN/Creatinine Ratio: 19 (ref 12–28)
BUN: 16 mg/dL (ref 8–27)
Bilirubin Total: 0.7 mg/dL (ref 0.0–1.2)
CO2: 26 mmol/L (ref 20–29)
Calcium: 10.5 mg/dL — ABNORMAL HIGH (ref 8.7–10.3)
Chloride: 99 mmol/L (ref 96–106)
Creatinine, Ser: 0.83 mg/dL (ref 0.57–1.00)
GFR calc Af Amer: 83 mL/min/{1.73_m2} (ref >59)
GFR calc non Af Amer: 72 mL/min/{1.73_m2} (ref >59)
Globulin, Total: 2.8 g/dL (ref 1.5–4.5)
Glucose: 125 mg/dL — ABNORMAL HIGH (ref 65–99)
Potassium: 4.3 mmol/L (ref 3.5–5.2)
Sodium: 142 mmol/L (ref 134–144)
Total Protein: 7.4 g/dL (ref 6.0–8.5)

## 2019-06-04 LAB — LIPID PANEL
Chol/HDL Ratio: 1.8 ratio (ref 0.0–4.4)
Cholesterol, Total: 125 mg/dL (ref 100–199)
HDL: 68 mg/dL (ref >39)
LDL Chol Calc (NIH): 45 mg/dL (ref 0–99)
Triglycerides: 53 mg/dL (ref 0–149)
VLDL Cholesterol Cal: 12 mg/dL (ref 5–40)

## 2019-06-04 LAB — HEMOGLOBIN A1C
Est. average glucose Bld gHb Est-mCnc: 140 mg/dL
Hgb A1c MFr Bld: 6.5 % — ABNORMAL HIGH (ref 4.8–5.6)

## 2019-06-04 LAB — TSH: TSH: 6.41 u[IU]/mL — ABNORMAL HIGH (ref 0.450–4.500)

## 2019-06-04 NOTE — Telephone Encounter (Signed)
Patient advised.

## 2019-06-04 NOTE — Telephone Encounter (Signed)
Patient advised as below and verbally voiced understanding. Patient states she has had some fatigue, but wants to hold off on starting a Thyroid medication. She states she mentioned to you that she has been having issues with profuse sweating that seems to come on all of a sudden like when she is in the grocery store. Patient wants to know if this could be related to her Thyroid?   She also states during the office visit, she mentioned that she had a rash under her breast. She wants to know if you were still going to call in a prescription for this?  Pharmacy [facility])

## 2019-06-04 NOTE — Telephone Encounter (Signed)
Normally with an underactive thyroid you have more episodes of cold and get cold easily (cold intolerance) than heat intolerance.   The cream (nystatin) was sent in yesterday.

## 2019-06-04 NOTE — Telephone Encounter (Signed)
-----   Message from Mar Daring, PA-C sent at 06/04/2019  8:45 AM EST ----- Blood count is stable. Kidney and liver function are normal. Sodium and potassium are normal. Calcium is very borderline high. Would recommend to cut back on calcium supplement. If taking one daily, can change to every other day. Cholesterol is normal. Thyroid is slightly underactive. If having symptoms of fatigue, weight gain, dry hair, dry skin, brittle nails we could start a thyroid medication, levothyroxine. If not, we can just monitor the lab. A1c/Sugar are up compared to last check. Your a1c had been 6.3 and is now 6.5. Still under goal of 7, but just need to be careful with diet through the holidays.

## 2019-07-05 ENCOUNTER — Other Ambulatory Visit: Payer: Self-pay | Admitting: Family Medicine

## 2019-07-05 DIAGNOSIS — I10 Essential (primary) hypertension: Secondary | ICD-10-CM

## 2019-07-20 ENCOUNTER — Other Ambulatory Visit: Payer: Self-pay | Admitting: Family Medicine

## 2019-07-20 ENCOUNTER — Other Ambulatory Visit: Payer: Self-pay | Admitting: Physician Assistant

## 2019-07-20 DIAGNOSIS — E1169 Type 2 diabetes mellitus with other specified complication: Secondary | ICD-10-CM

## 2019-07-20 DIAGNOSIS — E785 Hyperlipidemia, unspecified: Secondary | ICD-10-CM

## 2019-07-20 DIAGNOSIS — I10 Essential (primary) hypertension: Secondary | ICD-10-CM

## 2019-07-20 MED ORDER — ATORVASTATIN CALCIUM 40 MG PO TABS: 40.0000 mg | ORAL_TABLET | Freq: Every day | ORAL | 3 refills | Status: DC

## 2019-07-20 NOTE — Telephone Encounter (Signed)
[facility] faxed refill request for the following medications:  atorvastatin (LIPITOR) 40 MG tablet   Please advise.  

## 2019-07-23 ENCOUNTER — Ambulatory Visit: Payer: Medicare Other | Attending: Internal Medicine

## 2019-07-23 ENCOUNTER — Ambulatory Visit: Payer: Medicare Other

## 2019-07-23 DIAGNOSIS — Z23 Encounter for immunization: Secondary | ICD-10-CM

## 2019-07-23 NOTE — Progress Notes (Signed)
   Covid-19 Vaccination Clinic  Name:  CHARDE RENSLOW    MRN: [id number] DOB: 1950-11-16  07/23/2019  Ms. Richer was observed post Covid-19 immunization for 15 minutes without incidence. She was provided with Vaccine Information Sheet and instruction to access the V-Safe system.   Ms. Butterly was instructed to call 911 with any severe reactions post vaccine: Marland Kitchen Difficulty breathing  . Swelling of your face and throat  . A fast heartbeat  . A bad rash all over your body  . Dizziness and weakness    Immunizations Administered    Name Date Dose VIS Date Route   Pfizer COVID-19 Vaccine 07/23/2019  3:05 PM 0.3 mL 06/11/2019 Intramuscular   Manufacturer: [place]   Lot: [id number]   [place]: [id number]

## 2019-07-24 ENCOUNTER — Ambulatory Visit: Payer: Medicare Other

## 2019-08-13 ENCOUNTER — Ambulatory Visit: Payer: Medicare PPO | Attending: Internal Medicine

## 2019-08-13 DIAGNOSIS — Z23 Encounter for immunization: Secondary | ICD-10-CM

## 2019-08-13 NOTE — Progress Notes (Signed)
   Covid-19 Vaccination Clinic  Name:  Joyce Morton    MRN: [id number] DOB: 1951-03-09  08/13/2019  Joyce Morton was observed post Covid-19 immunization for 15 minutes without incidence. She was provided with Vaccine Information Sheet and instruction to access the V-Safe system.   Joyce Morton was instructed to call 911 with any severe reactions post vaccine: Marland Kitchen Difficulty breathing  . Swelling of your face and throat  . A fast heartbeat  . A bad rash all over your body  . Dizziness and weakness    Immunizations Administered    Name Date Dose VIS Date Route   Pfizer COVID-19 Vaccine 08/13/2019  2:43 PM 0.3 mL 06/11/2019 Intramuscular   Manufacturer: [place]   Lot: [id number]   [place]: [id number]

## 2019-09-02 ENCOUNTER — Encounter: Payer: Self-pay | Admitting: Physician Assistant

## 2019-09-02 ENCOUNTER — Other Ambulatory Visit: Payer: Self-pay

## 2019-09-02 ENCOUNTER — Ambulatory Visit (INDEPENDENT_AMBULATORY_CARE_PROVIDER_SITE_OTHER): Payer: Medicare PPO | Admitting: Physician Assistant

## 2019-09-02 VITALS — BP 142/80 | HR 98 | Temp 97.3°F | Wt 195.0 lb

## 2019-09-02 DIAGNOSIS — I10 Essential (primary) hypertension: Secondary | ICD-10-CM | POA: Diagnosis not present

## 2019-09-02 DIAGNOSIS — E119 Type 2 diabetes mellitus without complications: Secondary | ICD-10-CM | POA: Diagnosis not present

## 2019-09-02 LAB — POCT GLYCOSYLATED HEMOGLOBIN (HGB A1C): Hemoglobin A1C: 6.3 % — AB (ref 4.0–5.6)

## 2019-09-02 MED ORDER — ACCU-CHEK AVIVA PLUS VI STRP: ORAL_STRIP | 12 refills | Status: DC

## 2019-09-02 NOTE — Progress Notes (Signed)
Joyce Morton  MRN: [id number] DOB: 27-Apr-1951  Subjective:  HPI   The patient is a 69 year old female who presents for follow up.  Her last visit was on 06/03/19.   Hypertension, follow-up:  BP Readings from Last 3 Encounters:  09/02/19 (!) 142/80  06/03/19 (!) 149/90  03/10/19 (!) 152/94   Management since that visit includes no cjanges. She reports excellent compliance with treatment. She is not having side effects. none  Outside blood pressures are not being done.  Weight trend: stable Wt Readings from Last 3 Encounters:  09/02/19 195 lb (88.5 kg)  06/03/19 192 lb 12.8 oz (87.5 kg)  03/10/19 192 lb (87.1 kg)    Diabetes Mellitus Type II, Follow-up:   Lab Results  Component Value Date   HGBA1C 6.3 (A) 09/02/2019   HGBA1C 6.5 (H) 06/03/2019   HGBA1C 6.3 (A) 12/03/2018   She reports excellent compliance with treatment. She is not having side effects.  Current symptoms include none.. Home blood sugar records: fasting range: 120-154  Episodes of hypoglycemia? no  Pertinent Labs:    Component Value Date/Time   CHOL 125 06/03/2019 1025   TRIG 53 06/03/2019 1025   HDL 68 06/03/2019 1025   LDLCALC 45 06/03/2019 1025   CREATININE 0.83 06/03/2019 1025    Wt Readings from Last 3 Encounters:  09/02/19 195 lb (88.5 kg)  06/03/19 192 lb 12.8 oz (87.5 kg)  03/10/19 192 lb (87.1 kg)     Patient Active Problem List   Diagnosis Date Noted  . Family history of thalassemia 08/05/2017  . Benign neoplasm of cecum   . Polyp of sigmoid colon   . Benign neoplasm of transverse colon   . Hyperlipidemia 05/02/2015  . Diabetes mellitus, type II ([place]) 05/02/2015  . Hypertension, essential 05/02/2015    Past Medical History:  Diagnosis Date  . Anxiety   . Diabetes mellitus without complication ([place])   . GERD (gastroesophageal reflux disease)   . Hyperlipidemia   . Hypertension   . Vertigo    only a few brief episodes at night    Social History    Socioeconomic History  . Marital status: Married    Spouse name: Not on file  . Number of children: Not on file  . Years of education: Not on file  . Highest education level: Not on file  Occupational History  . Not on file  Tobacco Use  . Smoking status: Former Smoker    Quit date: 2008    Years since quitting: 13.1  . Smokeless tobacco: Never Used  Substance and Sexual Activity  . Alcohol use: No  . Drug use: No  . Sexual activity: Not on file  Other Topics Concern  . Not on file  Social History Narrative  . Not on file   Social Determinants of Health   Financial Resource Strain:   . Difficulty of Paying Living Expenses: Not on file  Food Insecurity:   . Worried About Charity fundraiser in the Last Year: Not on file  . Ran Out of Food in the Last Year: Not on file  Transportation Needs:   . Lack of Transportation (Medical): Not on file  . Lack of Transportation (Non-Medical): Not on file  Physical Activity:   . Days of Exercise per Week: Not on file  . Minutes of Exercise per Session: Not on file  Stress:   . Feeling of Stress : Not on file  Social Connections:   .  Frequency of Communication with Friends and Family: Not on file  . Frequency of Social Gatherings with Friends and Family: Not on file  . Attends Religious Services: Not on file  . Active Member of Clubs or Organizations: Not on file  . Attends Archivist Meetings: Not on file  . Marital Status: Not on file  Intimate Partner Violence:   . Fear of Current or Ex-Partner: Not on file  . Emotionally Abused: Not on file  . Physically Abused: Not on file  . Sexually Abused: Not on file    Outpatient Encounter Medications as of 09/02/2019  Medication Sig  . aspirin 81 MG tablet Take 81 mg by mouth daily.  Marland Kitchen atorvastatin (LIPITOR) 40 MG tablet Take 1 tablet (40 mg total) by mouth daily at 6 PM.  . blood glucose meter kit and supplies KIT Dispense based on patient and insurance preference. Use  2-4 x weekly.  Marland Kitchen CALCIUM PO Take by mouth daily.  . chlorthalidone (HYGROTON) 25 MG tablet TAKE 1 TABLET(25 MG) BY MOUTH DAILY  . famotidine (PEPCID) 10 MG tablet Take 10 mg by mouth daily.  . Lancets (ACCU-CHEK MULTICLIX) lancets Use as instructed  . losartan (COZAAR) 100 MG tablet Take 1 tablet (100 mg total) by mouth daily.  . meloxicam (MOBIC) 7.5 MG tablet Take 1 tablet (7.5 mg total) by mouth daily.  . metFORMIN (GLUCOPHAGE) 1000 MG tablet TAKE 1 TABLET(1000 MG) BY MOUTH TWICE DAILY WITH A MEAL  . Multiple Vitamin (MULTIVITAMIN) tablet Take 1 tablet by mouth daily.  Marland Kitchen nystatin cream (MYCOSTATIN) Apply 1 application topically 2 (two) times daily.  . [DISCONTINUED] glucose blood test strip Check fasting and two hours after a meal twice weekly  . glucose blood (ACCU-CHEK AVIVA PLUS) test strip To check blood sugar daily   No facility-administered encounter medications on file as of 09/02/2019.    Allergies  Allergen Reactions  . Lisinopril Cough  . Wellbutrin [Bupropion] Hives    Review of Systems  Constitutional: Negative for chills, diaphoresis, fever and malaise/fatigue.  HENT: Negative for congestion, ear pain, sinus pain and sore throat.   Respiratory: Negative for cough, shortness of breath and wheezing.   Cardiovascular: Negative for chest pain, palpitations and leg swelling.  Gastrointestinal: Negative for abdominal pain and diarrhea.  Genitourinary: Negative for frequency.  Neurological: Negative for dizziness and headaches.  Endo/Heme/Allergies: Negative for polydipsia.    Objective:  BP (!) 142/80 (BP Location: Left Arm, Patient Position: Sitting, Cuff Size: Normal)   Pulse 98   Temp (!) 97.3 F (36.3 C) (Skin)   Wt 195 lb (88.5 kg)   SpO2 100%   BMI 33.47 kg/m   Physical Exam  Constitutional: She is well-developed, well-nourished, and in no distress. No distress.  HENT:  Head: Normocephalic and atraumatic.  Eyes: EOM are normal. No scleral icterus.   Cardiovascular: Normal rate, regular rhythm and normal heart sounds.  No murmur heard. Pulmonary/Chest: Effort normal and breath sounds normal. No respiratory distress.  Musculoskeletal:        General: No edema.     Cervical back: Normal range of motion and neck supple.  Skin: She is not diaphoretic.  Vitals reviewed.   Assessment and Plan :   1. Hypertension, essential Stable. Continue Losartan '100mg'$ . Will return in 3 months for labs.   2. Type 2 diabetes mellitus without complication, without long-term current use of insulin (HCC) A1c stable at 6.3. Continue Metformin '1000mg'$  BID. On Losartan and Atorvastatin.  - glucose  blood (ACCU-CHEK AVIVA PLUS) test strip; To check blood sugar daily  Dispense: 100 each; Refill: 12

## 2019-09-02 NOTE — Patient Instructions (Signed)
DASH Eating Plan DASH stands for "Dietary Approaches to Stop Hypertension." The DASH eating plan is a healthy eating plan that has been shown to reduce high blood pressure (hypertension). It may also reduce your risk for type 2 diabetes, heart disease, and stroke. The DASH eating plan may also help with weight loss. What are tips for following this plan?  General guidelines  Avoid eating more than 2,300 mg (milligrams) of salt (sodium) a day. If you have hypertension, you may need to reduce your sodium intake to 1,500 mg a day.  Limit alcohol intake to no more than 1 drink a day for nonpregnant women and 2 drinks a day for men. One drink equals 12 oz of beer, 5 oz of wine, or 1 oz of hard liquor.  Work with your health care provider to maintain a healthy body weight or to lose weight. Ask what an ideal weight is for you.  Get at least 30 minutes of exercise that causes your heart to beat faster (aerobic exercise) most days of the week. Activities may include walking, swimming, or biking.  Work with your health care provider or diet and nutrition specialist (dietitian) to adjust your eating plan to your individual calorie needs. Reading food labels   Check food labels for the amount of sodium per serving. Choose foods with less than 5 percent of the Daily Value of sodium. Generally, foods with less than 300 mg of sodium per serving fit into this eating plan.  To find whole grains, look for the word "whole" as the first word in the ingredient list. Shopping  Buy products labeled as "low-sodium" or "no salt added."  Buy fresh foods. Avoid canned foods and premade or frozen meals. Cooking  Avoid adding salt when cooking. Use salt-free seasonings or herbs instead of table salt or sea salt. Check with your health care provider or pharmacist before using salt substitutes.  Do not fry foods. Cook foods using healthy methods such as baking, boiling, grilling, and broiling instead.  Cook with  heart-healthy oils, such as olive, canola, soybean, or sunflower oil. Meal planning  Eat a balanced diet that includes: ? 5 or more servings of fruits and vegetables each day. At each meal, try to fill half of your plate with fruits and vegetables. ? Up to 6-8 servings of whole grains each day. ? Less than 6 oz of lean meat, poultry, or fish each day. A 3-oz serving of meat is about the same size as a deck of cards. One egg equals 1 oz. ? 2 servings of low-fat dairy each day. ? A serving of nuts, seeds, or beans 5 times each week. ? Heart-healthy fats. Healthy fats called Omega-3 fatty acids are found in foods such as flaxseeds and coldwater fish, like sardines, salmon, and mackerel.  Limit how much you eat of the following: ? Canned or prepackaged foods. ? Food that is high in trans fat, such as fried foods. ? Food that is high in saturated fat, such as fatty meat. ? Sweets, desserts, sugary drinks, and other foods with added sugar. ? Full-fat dairy products.  Do not salt foods before eating.  Try to eat at least 2 vegetarian meals each week.  Eat more home-cooked food and less restaurant, buffet, and fast food.  When eating at a restaurant, ask that your food be prepared with less salt or no salt, if possible. What foods are recommended? The items listed may not be a complete list. Talk with your dietitian about   what dietary choices are best for you. Grains Whole-grain or whole-wheat bread. Whole-grain or whole-wheat pasta. Brown rice. Oatmeal. Quinoa. Bulgur. Whole-grain and low-sodium cereals. Pita bread. Low-fat, low-sodium crackers. Whole-wheat flour tortillas. Vegetables Fresh or frozen vegetables (raw, steamed, roasted, or grilled). Low-sodium or reduced-sodium tomato and vegetable juice. Low-sodium or reduced-sodium tomato sauce and tomato paste. Low-sodium or reduced-sodium canned vegetables. Fruits All fresh, dried, or frozen fruit. Canned fruit in natural juice (without  added sugar). Meat and other protein foods Skinless chicken or turkey. Ground chicken or turkey. Pork with fat trimmed off. Fish and seafood. Egg whites. Dried beans, peas, or lentils. Unsalted nuts, nut butters, and seeds. Unsalted canned beans. Lean cuts of beef with fat trimmed off. Low-sodium, lean deli meat. Dairy Low-fat (1%) or fat-free (skim) milk. Fat-free, low-fat, or reduced-fat cheeses. Nonfat, low-sodium ricotta or cottage cheese. Low-fat or nonfat yogurt. Low-fat, low-sodium cheese. Fats and oils Soft margarine without trans fats. Vegetable oil. Low-fat, reduced-fat, or light mayonnaise and salad dressings (reduced-sodium). Canola, safflower, olive, soybean, and sunflower oils. Avocado. Seasoning and other foods Herbs. Spices. Seasoning mixes without salt. Unsalted popcorn and pretzels. Fat-free sweets. What foods are not recommended? The items listed may not be a complete list. Talk with your dietitian about what dietary choices are best for you. Grains Baked goods made with fat, such as croissants, muffins, or some breads. Dry pasta or rice meal packs. Vegetables Creamed or fried vegetables. Vegetables in a cheese sauce. Regular canned vegetables (not low-sodium or reduced-sodium). Regular canned tomato sauce and paste (not low-sodium or reduced-sodium). Regular tomato and vegetable juice (not low-sodium or reduced-sodium). Pickles. Olives. Fruits Canned fruit in a light or heavy syrup. Fried fruit. Fruit in cream or butter sauce. Meat and other protein foods Fatty cuts of meat. Ribs. Fried meat. Bacon. Sausage. Bologna and other processed lunch meats. Salami. Fatback. Hotdogs. Bratwurst. Salted nuts and seeds. Canned beans with added salt. Canned or smoked fish. Whole eggs or egg yolks. Chicken or turkey with skin. Dairy Whole or 2% milk, cream, and half-and-half. Whole or full-fat cream cheese. Whole-fat or sweetened yogurt. Full-fat cheese. Nondairy creamers. Whipped toppings.  Processed cheese and cheese spreads. Fats and oils Butter. Stick margarine. Lard. Shortening. Ghee. Bacon fat. Tropical oils, such as coconut, palm kernel, or palm oil. Seasoning and other foods Salted popcorn and pretzels. Onion salt, garlic salt, seasoned salt, table salt, and sea salt. Worcestershire sauce. Tartar sauce. Barbecue sauce. Teriyaki sauce. Soy sauce, including reduced-sodium. Steak sauce. Canned and packaged gravies. Fish sauce. Oyster sauce. Cocktail sauce. Horseradish that you find on the shelf. Ketchup. Mustard. Meat flavorings and tenderizers. Bouillon cubes. Hot sauce and Tabasco sauce. Premade or packaged marinades. Premade or packaged taco seasonings. Relishes. Regular salad dressings. Where to find more information:  National Heart, Lung, and Blood Institute: www.nhlbi.nih.gov  American Heart Association: www.heart.org Summary  The DASH eating plan is a healthy eating plan that has been shown to reduce high blood pressure (hypertension). It may also reduce your risk for type 2 diabetes, heart disease, and stroke.  With the DASH eating plan, you should limit salt (sodium) intake to 2,300 mg a day. If you have hypertension, you may need to reduce your sodium intake to 1,500 mg a day.  When on the DASH eating plan, aim to eat more fresh fruits and vegetables, whole grains, lean proteins, low-fat dairy, and heart-healthy fats.  Work with your health care provider or diet and nutrition specialist (dietitian) to adjust your eating plan to your   individual calorie needs. This information is not intended to replace advice given to you by your health care provider. Make sure you discuss any questions you have with your health care provider. Document Revised: 05/30/2017 Document Reviewed: 06/10/2016 Elsevier Patient Education  2020 Elsevier Inc.  

## 2019-10-04 ENCOUNTER — Other Ambulatory Visit: Payer: Self-pay | Admitting: Family Medicine

## 2019-10-04 DIAGNOSIS — I10 Essential (primary) hypertension: Secondary | ICD-10-CM

## 2019-10-07 ENCOUNTER — Telehealth: Payer: Self-pay | Admitting: Physician Assistant

## 2019-12-02 ENCOUNTER — Ambulatory Visit: Payer: Medicare PPO | Admitting: Physician Assistant

## 2019-12-12 ENCOUNTER — Other Ambulatory Visit: Payer: Self-pay | Admitting: Physician Assistant

## 2019-12-16 LAB — HM DIABETES EYE EXAM

## 2019-12-27 ENCOUNTER — Ambulatory Visit (INDEPENDENT_AMBULATORY_CARE_PROVIDER_SITE_OTHER): Payer: Medicare PPO | Admitting: Physician Assistant

## 2019-12-27 ENCOUNTER — Other Ambulatory Visit: Payer: Self-pay

## 2019-12-27 ENCOUNTER — Encounter: Payer: Self-pay | Admitting: Physician Assistant

## 2019-12-27 VITALS — BP 118/83 | HR 91 | Temp 97.3°F | Resp 16 | Ht 64.0 in | Wt 193.0 lb

## 2019-12-27 DIAGNOSIS — E119 Type 2 diabetes mellitus without complications: Secondary | ICD-10-CM | POA: Diagnosis not present

## 2019-12-27 DIAGNOSIS — E1159 Type 2 diabetes mellitus with other circulatory complications: Secondary | ICD-10-CM | POA: Diagnosis not present

## 2019-12-27 DIAGNOSIS — I10 Essential (primary) hypertension: Secondary | ICD-10-CM

## 2019-12-27 LAB — POCT GLYCOSYLATED HEMOGLOBIN (HGB A1C)
Est. average glucose Bld gHb Est-mCnc: 146
Hemoglobin A1C: 6.7 % — AB (ref 4.0–5.6)

## 2019-12-27 MED ORDER — LOSARTAN POTASSIUM 100 MG PO TABS: 100.0000 mg | ORAL_TABLET | Freq: Every day | ORAL | 1 refills | Status: DC

## 2019-12-27 MED ORDER — CHLORTHALIDONE 25 MG PO TABS: ORAL_TABLET | ORAL | 1 refills | Status: DC

## 2019-12-27 NOTE — Patient Instructions (Signed)

## 2019-12-27 NOTE — Progress Notes (Signed)
Established patient visit   Patient: Joyce Morton   DOB: [birth year]   69 y.o. Female  MRN: [id number] Visit Date: 12/27/2019  Today's healthcare provider: Mar Daring, PA-C   Chief Complaint  Patient presents with  . Follow-up    T2DM and HTN   Subjective    HPI Hypertension, follow-up  BP Readings from Last 3 Encounters:  12/27/19 118/83  09/02/19 (!) 142/80  06/03/19 (!) 149/90   Wt Readings from Last 3 Encounters:  12/27/19 193 lb (87.5 kg)  09/02/19 195 lb (88.5 kg)  06/03/19 192 lb 12.8 oz (87.5 kg)     She was last seen for hypertension 3 months ago.  BP at that visit was 142/80. Management since that visit includes Continue Losartan 169m.  She reports excellent compliance with treatment. She is not having side effects.  She is following a Regular diet. She is exercising. Walking three times a week She does not smoke.  Outside blood pressures are none. Symptoms: No chest pain No chest pressure  No palpitations No syncope  No dyspnea No orthopnea  No paroxysmal nocturnal dyspnea No lower extremity edema   Pertinent labs: Lab Results  Component Value Date   CHOL 125 06/03/2019   HDL 68 06/03/2019   LDLCALC 45 06/03/2019   TRIG 53 06/03/2019   CHOLHDL 1.8 06/03/2019   Lab Results  Component Value Date   NA 142 06/03/2019   K 4.3 06/03/2019   CREATININE 0.83 06/03/2019   GFRNONAA 72 06/03/2019   GFRAA 83 06/03/2019   GLUCOSE 125 (H) 06/03/2019     The ASCVD Risk score (Goff DC Jr., et al., 2013) failed to calculate for the following reasons:   The valid total cholesterol range is 130 to 320 mg/dL   --------------------------------------------------------------------------------------------------- Diabetes Mellitus Type II, Follow-up  Lab Results  Component Value Date   HGBA1C 6.7 (A) 12/27/2019   HGBA1C 6.3 (A) 09/02/2019   HGBA1C 6.5 (H) 06/03/2019   Wt Readings from Last 3 Encounters:  12/27/19 193 lb (87.5 kg)    09/02/19 195 lb (88.5 kg)  06/03/19 192 lb 12.8 oz (87.5 kg)   Last seen for diabetes 3 months ago.  Management since then includes Continue Metformin 10052mBID. On Losartan and Atorvastatin.To check blood sugar daily   She reports excellent compliance with treatment. She is not having side effects.  Symptoms: No fatigue No foot ulcerations  No appetite changes No nausea  No paresthesia of the feet  No polydipsia  No polyuria No visual disturbances   No vomiting     Home blood sugar records: 150- 120 She does not checked every day  Episodes of hypoglycemia? No    Current insulin regiment: none Most Recent Eye Exam: a week ago. [facility]. Report on file.      Medications: Outpatient Medications Prior to Visit  Medication Sig  . aspirin 81 MG tablet Take 81 mg by mouth daily.  . Marland Kitchentorvastatin (LIPITOR) 40 MG tablet Take 1 tablet (40 mg total) by mouth daily at 6 PM.  . CALCIUM PO Take by mouth daily.  . chlorthalidone (HYGROTON) 25 MG tablet TAKE 1 TABLET(25 MG) BY MOUTH DAILY  . famotidine (PEPCID) 10 MG tablet Take 10 mg by mouth daily.  . Marland Kitchenlucose blood (ACCU-CHEK AVIVA PLUS) test strip To check blood sugar daily  . Lancets (ACCU-CHEK MULTICLIX) lancets Use as instructed  . losartan (COZAAR) 100 MG tablet Take 1 tablet (100 mg total) by mouth  daily.  . meloxicam (MOBIC) 7.5 MG tablet Take 1 tablet (7.5 mg total) by mouth daily.  . metFORMIN (GLUCOPHAGE) 1000 MG tablet TAKE 1 TABLET(1000 MG) BY MOUTH TWICE DAILY WITH A MEAL  . Multiple Vitamin (MULTIVITAMIN) tablet Take 1 tablet by mouth daily.  Marland Kitchen nystatin cream (MYCOSTATIN) Apply 1 application topically 2 (two) times daily.  . blood glucose meter kit and supplies KIT Dispense based on patient and insurance preference. Use 2-4 x weekly.   No facility-administered medications prior to visit.    Review of Systems  Constitutional: Negative.   Respiratory: Negative.   Cardiovascular: Negative.   Endocrine: Negative  for polydipsia, polyphagia and polyuria.  Psychiatric/Behavioral: Negative.       Objective    BP 118/83 (BP Location: Left Arm, Patient Position: Sitting, Cuff Size: Large)   Pulse 91   Temp (!) 97.3 F (36.3 C) (Temporal)   Resp 16   Ht 5' 4"  (1.626 m)   Wt 193 lb (87.5 kg)   BMI 33.13 kg/m    Physical Exam Vitals reviewed.  Constitutional:      General: She is not in acute distress.    Appearance: Normal appearance. She is well-developed and well-groomed. She is obese. She is not ill-appearing or diaphoretic.  Cardiovascular:     Rate and Rhythm: Normal rate and regular rhythm.     Heart sounds: Normal heart sounds. No murmur heard.  No friction rub. No gallop.   Pulmonary:     Effort: Pulmonary effort is normal. No respiratory distress.     Breath sounds: Normal breath sounds. No wheezing or rales.  Musculoskeletal:     Cervical back: Normal range of motion and neck supple.  Neurological:     Mental Status: She is alert.  Psychiatric:        Mood and Affect: Mood normal.        Behavior: Behavior normal. Behavior is cooperative.        Thought Content: Thought content normal.        Judgment: Judgment normal.     Diabetic Foot Exam - Simple   Simple Foot Form Diabetic Foot exam was performed with the following findings: Yes 12/27/2019  8:18 AM  Visual Inspection No deformities, no ulcerations, no other skin breakdown bilaterally: Yes Sensation Testing Intact to touch and monofilament testing bilaterally: Yes Pulse Check Posterior Tibialis and Dorsalis pulse intact bilaterally: Yes Comments     Results for orders placed or performed in visit on 12/27/19  POCT glycosylated hemoglobin (Hb A1C)  Result Value Ref Range   Hemoglobin A1C 6.7 (A) 4.0 - 5.6 %   Est. average glucose Bld gHb Est-mCnc 146     Assessment & Plan     1. Type 2 diabetes mellitus without complication, without long-term current use of insulin (HCC) A1c increased slightly from 6.3 to  6.7. Still under goal of 7. Will continue Metformin 1000 mg BID. F/U in 6 months for CPE/AWV.  2. Hypertension associated with diabetes ([employer]) Much improved today. Continue Losartan 130m and Chlorthalidone 237m   3. Hypertension, essential Stable. Diagnosis pulled for medication refill. Continue current medical treatment plan. - chlorthalidone (HYGROTON) 25 MG tablet; TAKE 1 TABLET(25 MG) BY MOUTH DAILY  Dispense: 90 tablet; Refill: 1 - losartan (COZAAR) 100 MG tablet; Take 1 tablet (100 mg total) by mouth daily.  Dispense: 90 tablet; Refill: 1   No follow-ups on file.      I,Reynolds BowlPA-C, have reviewed all documentation  for this visit. The documentation on 12/27/19 for the exam, diagnosis, procedures, and orders are all accurate and complete.   Rubye Beach  [facility] [phone] (phone) [phone] (fax)  [place]

## 2019-12-29 ENCOUNTER — Other Ambulatory Visit: Payer: Self-pay | Admitting: Physician Assistant

## 2019-12-29 DIAGNOSIS — I10 Essential (primary) hypertension: Secondary | ICD-10-CM

## 2019-12-29 NOTE — Telephone Encounter (Signed)
Requested Prescriptions  Pending Prescriptions Disp Refills  . chlorthalidone (HYGROTON) 25 MG tablet [Pharmacy Med Name: CHLORTHALIDONE 25MG  TABLETS] 90 tablet 1    Sig: TAKE 1 TABLET(25 MG) BY MOUTH DAILY     Cardiovascular: Diuretics - Thiazide Failed - 12/29/2019  6:26 PM      Failed - Ca in normal range and within 360 days    Calcium  Date Value Ref Range Status  06/03/2019 10.5 (H) 8.7 - 10.3 mg/dL Final         Passed - Cr in normal range and within 360 days    Creatinine, Ser  Date Value Ref Range Status  06/03/2019 0.83 0.57 - 1.00 mg/dL Final         Passed - K in normal range and within 360 days    Potassium  Date Value Ref Range Status  06/03/2019 4.3 3.5 - 5.2 mmol/L Final         Passed - Na in normal range and within 360 days    Sodium  Date Value Ref Range Status  06/03/2019 142 134 - 144 mmol/L Final         Passed - Last BP in normal range    BP Readings from Last 1 Encounters:  12/27/19 118/83         Passed - Valid encounter within last 6 months    Recent Outpatient Visits          2 days ago Type 2 diabetes mellitus without complication, without long-term current use of insulin [facility])   [facility] [place], [place], [place]   3 months ago Hypertension, essential   [facility], [place], PA-C   6 months ago Annual physical exam   [facility] [place], [place]   9 months ago Primary osteoarthritis of right knee   [facility] [place], [place]   11 months ago Strain of muscle(s) and tendon(s) of peroneal muscle group at lower leg level, left leg, initial encounter   [facility] [place], [name], [place]      Future Appointments            In 5 months Burnette, [name], PA-C [employer], [facility]           . losartan (COZAAR) 100 MG tablet [employer] Med Name: LOSARTAN 100MG  TABLETS] 90 tablet 1    Sig: TAKE 1 TABLET(100 MG)  BY MOUTH DAILY     Cardiovascular:  Angiotensin Receptor Blockers Failed - 12/29/2019  6:26 PM      Failed - Cr in normal range and within 180 days    Creatinine, Ser  Date Value Ref Range Status  06/03/2019 0.83 0.57 - 1.00 mg/dL Final         Failed - K in normal range and within 180 days    Potassium  Date Value Ref Range Status  06/03/2019 4.3 3.5 - 5.2 mmol/L Final         Passed - Patient is not pregnant      Passed - Last BP in normal range    BP Readings from Last 1 Encounters:  12/27/19 118/83         Passed - Valid encounter within last 6 months    Recent Outpatient Visits          2 days ago Type 2 diabetes mellitus without complication, without long-term current use of insulin ([place])   [facility] [place], [place]  M, PA-C   3 months ago Hypertension, essential   [place], [place]   6 months ago Annual physical exam   [facility] Fenton Malling M, [place]   9 months ago Primary osteoarthritis of right knee   [facility] F[place] Malling M, [place]   11 months ago Strain of muscle(s) and tendon(s) of peroneal muscle group at lower leg level, left leg, initial encounter   [place], [place]      Future Appointments            In 5 months Burnette, [name], PA-C [employer], [place]

## 2020-03-07 ENCOUNTER — Telehealth: Payer: Self-pay | Admitting: Physician Assistant

## 2020-03-07 NOTE — Telephone Encounter (Signed)
Copied from [place] [id number]. Topic: Medicare AWV >> Mar 07, 2020 11:27 AM Cher Nakai R wrote: Reason for CRM: Left message for patient to call back and schedule Medicare Annual Wellness Visit (AWV) either virtually or in office.  No hx of AWV eligible as of 12/30/2015  Please schedule at anytime with [facility] Health Advisor.

## 2020-03-27 ENCOUNTER — Ambulatory Visit: Payer: Medicare PPO | Attending: Internal Medicine

## 2020-03-27 DIAGNOSIS — Z23 Encounter for immunization: Secondary | ICD-10-CM

## 2020-03-27 NOTE — Progress Notes (Signed)
   Covid-19 Vaccination Clinic  Name:  Joyce Morton    MRN: [id number] DOB: Aug 28, 1950  03/27/2020  [name] was observed post Covid-19 immunization for 15 minutes without incident. She was provided with Vaccine Information Sheet and instruction to access the V-Safe system.   [name] was instructed to call 911 with any severe reactions post vaccine: Marland Kitchen Difficulty breathing  . Swelling of face and throat  . A fast heartbeat  . A bad rash all over body  . Dizziness and weakness

## 2020-06-05 ENCOUNTER — Encounter: Payer: Self-pay | Admitting: Physician Assistant

## 2020-07-03 ENCOUNTER — Other Ambulatory Visit: Payer: Self-pay | Admitting: Physician Assistant

## 2020-07-03 NOTE — Telephone Encounter (Signed)
Requested Prescriptions  Pending Prescriptions Disp Refills  . metFORMIN (GLUCOPHAGE) 1000 MG tablet [Pharmacy Med Name: METFORMIN 1000MG TABLETS] 60 tablet 0    Sig: TAKE 1 TABLET(1000 MG) BY MOUTH TWICE DAILY WITH A MEAL     Endocrinology:  Diabetes - Biguanides Failed - 07/03/2020  8:55 AM      Failed - Cr in normal range and within 360 days    Creatinine, Ser  Date Value Ref Range Status  06/03/2019 0.83 0.57 - 1.00 mg/dL Final         Failed - HBA1C is between 0 and 7.9 and within 180 days    Hemoglobin A1C  Date Value Ref Range Status  12/27/2019 6.7 (A) 4.0 - 5.6 % Final  12/03/2016 7.0  Final   Hgb A1c MFr Bld  Date Value Ref Range Status  06/03/2019 6.5 (H) 4.8 - 5.6 % Final    Comment:             Prediabetes: 5.7 - 6.4          Diabetes: >6.4          Glycemic control for adults with diabetes: <7.0          Failed - eGFR in normal range and within 360 days    GFR calc Af Amer  Date Value Ref Range Status  06/03/2019 83 >59 mL/min/1.73 Final   GFR calc non Af Amer  Date Value Ref Range Status  06/03/2019 72 >59 mL/min/1.73 Final         Failed - Valid encounter within last 6 months    Recent Outpatient Visits          6 months ago Type 2 diabetes mellitus without complication, without long-term current use of insulin [facility])   [place], [place], [place]   10 months ago Hypertension, essential   [place], [name], [place]   1 year ago Annual physical exam   [place], [name], [place]   1 year ago Primary osteoarthritis of right knee   [place], [place], [place]   1 year ago Strain of muscle(s) and tendon(s) of peroneal muscle group at lower leg level, left leg, initial encounter   [place], [name], [place]      Future Appointments            In 2 weeks Marlyn Corporal, [name], PA-C [employer], [place]

## 2020-07-12 ENCOUNTER — Telehealth: Payer: Self-pay

## 2020-07-12 DIAGNOSIS — I10 Essential (primary) hypertension: Secondary | ICD-10-CM

## 2020-07-12 MED ORDER — CHLORTHALIDONE 25 MG PO TABS: 25.0000 mg | ORAL_TABLET | Freq: Every day | ORAL | 1 refills | Status: DC

## 2020-07-12 NOTE — Telephone Encounter (Signed)
[place] faxed refill request for the following medications:  chlorthalidone (HYGROTON) 25 MG tablet   Please advise. Thanks, [employer]

## 2020-07-14 ENCOUNTER — Telehealth: Payer: Self-pay

## 2020-07-14 DIAGNOSIS — E1169 Type 2 diabetes mellitus with other specified complication: Secondary | ICD-10-CM

## 2020-07-14 DIAGNOSIS — E785 Hyperlipidemia, unspecified: Secondary | ICD-10-CM

## 2020-07-14 MED ORDER — ATORVASTATIN CALCIUM 40 MG PO TABS: 40.0000 mg | ORAL_TABLET | Freq: Every day | ORAL | 3 refills | Status: DC

## 2020-07-14 NOTE — Telephone Encounter (Signed)
[facility] faxed refill request for the following medications:  atorvastatin (LIPITOR) 40 MG tablet   Please advise.  

## 2020-07-17 ENCOUNTER — Encounter: Payer: Self-pay | Admitting: Physician Assistant

## 2020-07-18 ENCOUNTER — Telehealth: Payer: Self-pay | Admitting: Physician Assistant

## 2020-07-18 ENCOUNTER — Other Ambulatory Visit: Payer: Self-pay | Admitting: Physician Assistant

## 2020-07-18 DIAGNOSIS — I10 Essential (primary) hypertension: Secondary | ICD-10-CM

## 2020-07-18 NOTE — Telephone Encounter (Signed)
Copied from [place] [id number]. Topic: Medicare AWV >> Jul 18, 2020 10:29 AM Cher Nakai R wrote: Reason for CRM:  Left message for patient to call back and schedule Medicare Annual Wellness Visit (AWV) in office.   If not able to come in office, please offer to do virtually.   Last AWV  12/30/2015  Please schedule at anytime with [facility] Health Advisor.  If any questions, please contact me at [phone]

## 2020-07-20 ENCOUNTER — Other Ambulatory Visit: Payer: Self-pay | Admitting: Physician Assistant

## 2020-07-20 DIAGNOSIS — I10 Essential (primary) hypertension: Secondary | ICD-10-CM

## 2020-08-04 ENCOUNTER — Encounter: Payer: Medicare PPO | Admitting: Physician Assistant

## 2020-08-04 ENCOUNTER — Encounter: Payer: Self-pay | Admitting: Physician Assistant

## 2020-08-04 ENCOUNTER — Other Ambulatory Visit: Payer: Self-pay

## 2020-08-04 ENCOUNTER — Ambulatory Visit (INDEPENDENT_AMBULATORY_CARE_PROVIDER_SITE_OTHER): Payer: Medicare PPO | Admitting: Physician Assistant

## 2020-08-04 VITALS — BP 152/86 | HR 97 | Temp 98.4°F | Ht 64.0 in | Wt 185.0 lb

## 2020-08-04 DIAGNOSIS — Z832 Family history of diseases of the blood and blood-forming organs and certain disorders involving the immune mechanism: Secondary | ICD-10-CM

## 2020-08-04 DIAGNOSIS — E7800 Pure hypercholesterolemia, unspecified: Secondary | ICD-10-CM | POA: Diagnosis not present

## 2020-08-04 DIAGNOSIS — E119 Type 2 diabetes mellitus without complications: Secondary | ICD-10-CM

## 2020-08-04 DIAGNOSIS — E6609 Other obesity due to excess calories: Secondary | ICD-10-CM

## 2020-08-04 DIAGNOSIS — E1159 Type 2 diabetes mellitus with other circulatory complications: Secondary | ICD-10-CM | POA: Diagnosis not present

## 2020-08-04 DIAGNOSIS — Z0000 Encounter for general adult medical examination without abnormal findings: Secondary | ICD-10-CM

## 2020-08-04 DIAGNOSIS — Z1239 Encounter for other screening for malignant neoplasm of breast: Secondary | ICD-10-CM

## 2020-08-04 DIAGNOSIS — Z13820 Encounter for screening for osteoporosis: Secondary | ICD-10-CM

## 2020-08-04 DIAGNOSIS — I152 Hypertension secondary to endocrine disorders: Secondary | ICD-10-CM

## 2020-08-04 DIAGNOSIS — Z6831 Body mass index (BMI) 31.0-31.9, adult: Secondary | ICD-10-CM

## 2020-08-04 MED ORDER — ACCU-CHEK MULTICLIX LANCETS MISC: 3 refills | Status: DC

## 2020-08-04 MED ORDER — ACCU-CHEK AVIVA PLUS VI STRP: ORAL_STRIP | 12 refills | Status: DC

## 2020-08-04 NOTE — Progress Notes (Signed)
Complete physical exam   Patient: Joyce Morton   DOB: 1950-11-21   70 y.o. Female  MRN: [id number] Visit Date: 08/04/2020  Today's healthcare provider: Mar Daring, PA-C   No chief complaint on file.  Subjective    Joyce Morton is a 70 y.o. female who presents today for a complete physical exam.  She reports consuming a general diet. Exercises some. She generally feels well. She reports sleeping well. She does not have additional problems to discuss today.  HPI    Past Medical History:  Diagnosis Date  . Anxiety   . Diabetes mellitus without complication ([place])   . GERD (gastroesophageal reflux disease)   . Hyperlipidemia   . Hypertension   . Vertigo    only a few brief episodes at night   Past Surgical History:  Procedure Laterality Date  . COLONOSCOPY WITH PROPOFOL N/A 01/16/2017   Procedure: COLONOSCOPY WITH PROPOFOL;  Surgeon: Lucilla Lame, MD;  Location: [place];  Service: Endoscopy;  Laterality: N/A;  Diabetic - oral meds  . NO PAST SURGERIES    . POLYPECTOMY N/A 01/16/2017   Procedure: POLYPECTOMY;  Surgeon: Lucilla Lame, MD;  Location: [place];  Service: Endoscopy;  Laterality: N/A;   Social History   Socioeconomic History  . Marital status: Married    Spouse name: Not on file  . Number of children: Not on file  . Years of education: Not on file  . Highest education level: Not on file  Occupational History  . Not on file  Tobacco Use  . Smoking status: Former Smoker    Quit date: 2008    Years since quitting: 14.1  . Smokeless tobacco: Never Used  Vaping Use  . Vaping Use: Never used  Substance and Sexual Activity  . Alcohol use: No  . Drug use: No  . Sexual activity: Not on file  Other Topics Concern  . Not on file  Social History Narrative  . Not on file   Social Determinants of Health   Financial Resource Strain: Not on file  Food Insecurity: Not on file  Transportation Needs: Not on file   Physical Activity: Not on file  Stress: Not on file  Social Connections: Not on file  Intimate Partner Violence: Not on file   Family Status  Relation Name Status  . Mother  Alive  . Father  Deceased  . Sister  Alive  . Sister  Alive  . Sister  Alive  . Neg Hx  (Not Specified)   Family History  Problem Relation Age of Onset  . Dementia Mother   . Prostate cancer Father   . Healthy Sister   . Healthy Sister   . Healthy Sister   . Breast cancer Neg Hx   . Colon cancer Neg Hx   . Ovarian cancer Neg Hx    Allergies  Allergen Reactions  . Lisinopril Cough  . Wellbutrin [Bupropion] Hives    Patient Care Team: Mar Daring, PA-C as PCP - General (Family Medicine)   Medications: Outpatient Medications Prior to Visit  Medication Sig  . aspirin 81 MG tablet Take 81 mg by mouth daily.  Marland Kitchen atorvastatin (LIPITOR) 40 MG tablet Take 1 tablet (40 mg total) by mouth daily at 6 PM.  . blood glucose meter kit and supplies KIT Dispense based on patient and insurance preference. Use 2-4 x weekly.  Marland Kitchen CALCIUM PO Take by mouth daily.  . chlorthalidone (HYGROTON) 25 MG  tablet Take 1 tablet (25 mg total) by mouth daily.  . famotidine (PEPCID) 10 MG tablet Take 10 mg by mouth daily.  Marland Kitchen glucose blood (ACCU-CHEK AVIVA PLUS) test strip To check blood sugar daily  . Lancets (ACCU-CHEK MULTICLIX) lancets Use as instructed  . losartan (COZAAR) 100 MG tablet TAKE 1 TABLET(100 MG) BY MOUTH DAILY  . metFORMIN (GLUCOPHAGE) 1000 MG tablet TAKE 1 TABLET(1000 MG) BY MOUTH TWICE DAILY WITH A MEAL  . Multiple Vitamin (MULTIVITAMIN) tablet Take 1 tablet by mouth daily.  Marland Kitchen nystatin cream (MYCOSTATIN) Apply 1 application topically 2 (two) times daily.   No facility-administered medications prior to visit.    Review of Systems  Constitutional: Negative.   HENT: Negative.   Eyes: Negative.   Respiratory: Negative.   Cardiovascular: Negative.   Gastrointestinal: Negative.   Endocrine: Positive  for heat intolerance.  Genitourinary: Negative.   Musculoskeletal: Negative.   Skin: Negative.   Allergic/Immunologic: Negative.   Neurological: Negative.   Hematological: Negative.   Psychiatric/Behavioral: Negative.     Last CBC Lab Results  Component Value Date   WBC 7.4 06/03/2019   HGB 11.4 06/03/2019   HCT 36.8 06/03/2019   MCV 62 (L) 06/03/2019   MCH 19.1 (L) 06/03/2019   RDW 17.9 (H) 06/03/2019   PLT 444 81/84/0375   Last metabolic panel Lab Results  Component Value Date   GLUCOSE 125 (H) 06/03/2019   NA 142 06/03/2019   K 4.3 06/03/2019   CL 99 06/03/2019   CO2 26 06/03/2019   BUN 16 06/03/2019   CREATININE 0.83 06/03/2019   GFRNONAA 72 06/03/2019   GFRAA 83 06/03/2019   CALCIUM 10.5 (H) 06/03/2019   PROT 7.4 06/03/2019   ALBUMIN 4.6 06/03/2019   LABGLOB 2.8 06/03/2019   AGRATIO 1.6 06/03/2019   BILITOT 0.7 06/03/2019   ALKPHOS 65 06/03/2019   AST 28 06/03/2019   ALT 38 (H) 06/03/2019      Objective    BP (!) 152/86 (BP Location: Left Arm, Patient Position: Sitting, Cuff Size: Large)   Pulse 97   Temp 98.4 F (36.9 C) (Oral)   Ht 5' 4"  (1.626 m)   Wt 185 lb (83.9 kg)   BMI 31.76 kg/m  BP Readings from Last 3 Encounters:  08/04/20 (!) 152/86  12/27/19 118/83  09/02/19 (!) 142/80   Wt Readings from Last 3 Encounters:  08/04/20 185 lb (83.9 kg)  12/27/19 193 lb (87.5 kg)  09/02/19 195 lb (88.5 kg)      Physical Exam Vitals reviewed.  Constitutional:      General: She is not in acute distress.    Appearance: Normal appearance. She is well-developed and well-nourished. She is obese. She is not ill-appearing or diaphoretic.  HENT:     Head: Normocephalic and atraumatic.     Right Ear: Tympanic membrane, ear canal and external ear normal.     Left Ear: Tympanic membrane, ear canal and external ear normal.     Mouth/Throat:     Mouth: Oropharynx is clear and moist.  Eyes:     General: No scleral icterus.       Right eye: No discharge.         Left eye: No discharge.     Extraocular Movements: Extraocular movements intact and EOM normal.     Conjunctiva/sclera: Conjunctivae normal.     Pupils: Pupils are equal, round, and reactive to light.  Neck:     Thyroid: No thyromegaly.     Vascular:  No carotid bruit or JVD.     Trachea: No tracheal deviation.  Cardiovascular:     Rate and Rhythm: Normal rate and regular rhythm.     Pulses: Normal pulses and intact distal pulses.     Heart sounds: Normal heart sounds. No murmur heard. No friction rub. No gallop.   Pulmonary:     Effort: Pulmonary effort is normal. No respiratory distress.     Breath sounds: Normal breath sounds. No wheezing or rales.  Chest:     Chest wall: No tenderness.  Abdominal:     General: Abdomen is flat. Bowel sounds are normal. There is no distension.     Palpations: Abdomen is soft. There is no mass.     Tenderness: There is no abdominal tenderness. There is no guarding or rebound.  Musculoskeletal:        General: No tenderness or edema. Normal range of motion.     Cervical back: Normal range of motion and neck supple. No tenderness.     Right lower leg: No edema.     Left lower leg: No edema.  Lymphadenopathy:     Cervical: No cervical adenopathy.  Skin:    General: Skin is warm and dry.     Capillary Refill: Capillary refill takes less than 2 seconds.     Findings: No rash.  Neurological:     General: No focal deficit present.     Mental Status: She is alert and oriented to person, place, and time. Mental status is at baseline.  Psychiatric:        Mood and Affect: Mood and affect and mood normal.        Behavior: Behavior normal.        Thought Content: Thought content normal.        Judgment: Judgment normal.     Last depression screening scores PHQ 2/9 Scores 06/03/2019 12/03/2016  PHQ - 2 Score 1 0   Last fall risk screening Fall Risk  12/27/2019  Falls in the past year? 0  Number falls in past yr: 0  Injury with Fall? 0  Follow  up Falls evaluation completed   Last Audit-C alcohol use screening No flowsheet data found. A score of 3 or more in women, and 4 or more in men indicates increased risk for alcohol abuse, EXCEPT if all of the points are from question 1   No results found for any visits on 08/04/20.  Assessment & Plan    Routine Health Maintenance and Physical Exam  Exercise Activities and Dietary recommendations Goals   None     Immunization History  Administered Date(s) Administered  . Influenza, High Dose Seasonal PF 04/07/2018  . Influenza-Unspecified 04/15/2015, 04/04/2017, 04/12/2020  . PFIZER(Purple Top)SARS-COV-2 Vaccination 07/23/2019, 08/13/2019, 03/27/2020  . Pneumococcal Conjugate-13 05/29/2015  . Pneumococcal Polysaccharide-23 12/03/2016  . Zoster Recombinat (Shingrix) 01/08/2018, 03/19/2018    Health Maintenance  Topic Date Due  . MAMMOGRAM  09/04/2001  . DEXA SCAN  Never done  . HEMOGLOBIN A1C  06/27/2020  . TETANUS/TDAP  06/02/2029 (Originally 01/03/1969)  . OPHTHALMOLOGY EXAM  12/15/2020  . FOOT EXAM  12/26/2020  . COLONOSCOPY (Pts 45-60yr Insurance coverage will need to be confirmed)  01/16/2022  . INFLUENZA VACCINE  Completed  . COVID-19 Vaccine  Completed  . Hepatitis C Screening  Completed  . PNA vac Low Risk Adult  Completed    Discussed health benefits of physical activity, and encouraged her to engage in regular exercise appropriate for her  age and condition.  1. Medicare annual wellness visit, subsequent Normal exam today. No complaints. Vaccinations and screenings updated and discussed.   2. Annual physical exam Normal physical exam today. Will check labs as below and f/u pending lab results. If labs are stable and WNL she will not need to have these rechecked for one year at her next annual physical exam. She is to call the office in the meantime if she has any acute issue, questions or concerns. - CBC w/Diff/Platelet - Comprehensive Metabolic Panel (CMET) -  TSH - Lipid Panel With LDL/HDL Ratio - HgB A1c  3. Type 2 diabetes mellitus without complication, without long-term current use of insulin (HCC) Stable. Continue metformin 1060m BID. Will check labs as below and f/u pending results. - Lancets (ACCU-CHEK MULTICLIX) lancets; Use as instructed  Dispense: 100 each; Refill: 3 - glucose blood (ACCU-CHEK AVIVA PLUS) test strip; To check blood sugar daily  Dispense: 100 each; Refill: 12 - CBC w/Diff/Platelet - Comprehensive Metabolic Panel (CMET) - TSH - Lipid Panel With LDL/HDL Ratio - HgB A1c  4. Hypertension associated with diabetes ([place] Borderline elevated. Will monitor at home. Continue Losartan 1070mand chlorthalidone 259maily. Will check labs as below and f/u pending results. - CBC w/Diff/Platelet - Comprehensive Metabolic Panel (CMET) - TSH - Lipid Panel With LDL/HDL Ratio - HgB A1c  5. Pure hypercholesterolemia Stable. Continue Atorvastatin 39m81mill check labs as below and f/u pending results. - CBC w/Diff/Platelet - Comprehensive Metabolic Panel (CMET) - TSH - Lipid Panel With LDL/HDL Ratio - HgB A1c  6. Family history of thalassemia Will check labs as below and f/u pending results. - CBC w/Diff/Platelet  7. Class 1 obesity due to excess calories with serious comorbidity and body mass index (BMI) of 31.0 to 31.9 in adult Counseled patient on healthy lifestyle modifications including dieting and exercise.  Will check labs as below and f/u pending results. - Comprehensive Metabolic Panel (CMET) - TSH - Lipid Panel With LDL/HDL Ratio - HgB A1c  8. Osteoporosis screening Due for bone density, none ever done before. Ordered as below. - DG Bone Density; Future  9. Encounter for breast cancer screening using non-mammogram modality Due for mammogram. Mammogram ordered as below. Continue self breast exams. [place] number printed on AVS.  - MM 3D SCREEN BREAST BILATERAL; Future   No follow-ups on file.     I,  Reynolds Bowl-C, have reviewed all documentation for this visit. The documentation on 08/04/20 for the exam, diagnosis, procedures, and orders are all accurate and complete.   JennRubye BeachrlNew England Laser And [facility] LLC-[phone]) [phone])  [facility]

## 2020-08-04 NOTE — Patient Instructions (Signed)
[facility] [address] [place],  [place]  [address] Main: [phone]   Preventive Care 65 Years and Older, Female Preventive care refers to lifestyle choices and visits with your health care provider that can promote health and wellness. This includes:  A yearly physical exam. This is also called an annual wellness visit.  Regular dental and eye exams.  Immunizations.  Screening for certain conditions.  Healthy lifestyle choices, such as: ? Eating a healthy diet. ? Getting regular exercise. ? Not using drugs or products that contain nicotine and tobacco. ? Limiting alcohol use. What can I expect for my preventive care visit? Physical exam Your health care provider will check your:  Height and weight. These may be used to calculate your BMI (body mass index). BMI is a measurement that tells if you are at a healthy weight.  Heart rate and blood pressure.  Body temperature.  Skin for abnormal spots. Counseling Your health care provider may ask you questions about your:  Past medical problems.  Family's medical history.  Alcohol, tobacco, and drug use.  Emotional well-being.  Home life and relationship well-being.  Sexual activity.  Diet, exercise, and sleep habits.  History of falls.  Memory and ability to understand (cognition).  Work and work environment.  Pregnancy and menstrual history.  Access to firearms. What immunizations do I need? Vaccines are usually given at various ages, according to a schedule. Your health care provider will recommend vaccines for you based on your age, medical history, and lifestyle or other factors, such as travel or where you work.   What tests do I need? Blood tests  Lipid and cholesterol levels. These may be checked every 5 years, or more often depending on your overall health.  Hepatitis C test.  Hepatitis B test. Screening  Lung cancer screening. You may have this  screening every year starting at age 55 if you have a 30-pack-year history of smoking and currently smoke or have quit within the past 15 years.  Colorectal cancer screening. ? All adults should have this screening starting at age 50 and continuing until age 75. ? Your health care provider may recommend screening at age 45 if you are at increased risk. ? You will have tests every 1-10 years, depending on your results and the type of screening test.  Diabetes screening. ? This is done by checking your blood sugar (glucose) after you have not eaten for a while (fasting). ? You may have this done every 1-3 years.  Mammogram. ? This may be done every 1-2 years. ? Talk with your health care provider about how often you should have regular mammograms.  Abdominal aortic aneurysm (AAA) screening. You may need this if you are a current or former smoker.  BRCA-related cancer screening. This may be done if you have a family history of breast, ovarian, tubal, or peritoneal cancers. Other tests  STD (sexually transmitted disease) testing, if you are at risk.  Bone density scan. This is done to screen for osteoporosis. You may have this done starting at age 65. Talk with your health care provider about your test results, treatment options, and if necessary, the need for more tests. Follow these instructions at home: Eating and drinking  Eat a diet that includes fresh fruits and vegetables, whole grains, lean protein, and low-fat dairy products. Limit your intake of foods with high amounts of sugar, saturated fats, and salt.  Take vitamin and mineral supplements as recommended by   your health care provider.  Do not drink alcohol if your health care provider tells you not to drink.  If you drink alcohol: ? Limit how much you have to 0-1 drink a day. ? Be aware of how much alcohol is in your drink. In the [place] one drink equals one 12 oz bottle of beer (355 mL), one 5 oz glass of wine (148 mL), or  one 1 oz glass of hard liquor (44 mL).   Lifestyle  Take daily care of your teeth and gums. Brush your teeth every morning and night with fluoride toothpaste. Floss one time each day.  Stay active. Exercise for at least 30 minutes 5 or more days each week.  Do not use any products that contain nicotine or tobacco, such as cigarettes, e-cigarettes, and chewing tobacco. If you need help quitting, ask your health care provider.  Do not use drugs.  If you are sexually active, practice safe sex. Use a condom or other form of protection in order to prevent STIs (sexually transmitted infections).  Talk with your health care provider about taking a low-dose aspirin or statin.  Find healthy ways to cope with stress, such as: ? Meditation, yoga, or listening to music. ? Journaling. ? Talking to a trusted person. ? Spending time with friends and family. Safety  Always wear your seat belt while driving or riding in a vehicle.  Do not drive: ? If you have been drinking alcohol. Do not ride with someone who has been drinking. ? When you are tired or distracted. ? While texting.  Wear a helmet and other protective equipment during sports activities.  If you have firearms in your house, make sure you follow all gun safety procedures. What's next?  Visit your health care provider once a year for an annual wellness visit.  Ask your health care provider how often you should have your eyes and teeth checked.  Stay up to date on all vaccines. This information is not intended to replace advice given to you by your health care provider. Make sure you discuss any questions you have with your health care provider. Document Revised: 06/07/2020 Document Reviewed: 06/11/2018 Elsevier Patient Education  2021 Elsevier Inc.   

## 2020-08-05 LAB — CBC WITH DIFFERENTIAL/PLATELET
Basophils Absolute: 0.1 10*3/uL (ref 0.0–0.2)
Basos: 1 %
EOS (ABSOLUTE): 0.3 10*3/uL (ref 0.0–0.4)
Eos: 3 %
Hematocrit: 37.2 % (ref 34.0–46.6)
Hemoglobin: 11.6 g/dL (ref 11.1–15.9)
Immature Grans (Abs): 0 10*3/uL (ref 0.0–0.1)
Immature Granulocytes: 0 %
Lymphocytes Absolute: 2.2 10*3/uL (ref 0.7–3.1)
Lymphs: 24 %
MCH: 18.8 pg — ABNORMAL LOW (ref 26.6–33.0)
MCHC: 31.2 g/dL — ABNORMAL LOW (ref 31.5–35.7)
MCV: 60 fL — ABNORMAL LOW (ref 79–97)
Monocytes Absolute: 0.6 10*3/uL (ref 0.1–0.9)
Monocytes: 6 %
Neutrophils Absolute: 6 10*3/uL (ref 1.4–7.0)
Neutrophils: 66 %
Platelets: 482 10*3/uL — ABNORMAL HIGH (ref 150–450)
RBC: 6.16 x10E6/uL — ABNORMAL HIGH (ref 3.77–5.28)
RDW: 17.9 % — ABNORMAL HIGH (ref 11.7–15.4)
WBC: 9.2 10*3/uL (ref 3.4–10.8)

## 2020-08-05 LAB — HEMOGLOBIN A1C
Est. average glucose Bld gHb Est-mCnc: 140 mg/dL
Hgb A1c MFr Bld: 6.5 % — ABNORMAL HIGH (ref 4.8–5.6)

## 2020-08-05 LAB — COMPREHENSIVE METABOLIC PANEL
ALT: 30 IU/L (ref 0–32)
AST: 25 IU/L (ref 0–40)
Albumin/Globulin Ratio: 2 (ref 1.2–2.2)
Albumin: 4.9 g/dL — ABNORMAL HIGH (ref 3.8–4.8)
Alkaline Phosphatase: 65 IU/L (ref 44–121)
BUN/Creatinine Ratio: 27 (ref 12–28)
BUN: 23 mg/dL (ref 8–27)
Bilirubin Total: 0.7 mg/dL (ref 0.0–1.2)
CO2: 26 mmol/L (ref 20–29)
Calcium: 10.9 mg/dL — ABNORMAL HIGH (ref 8.7–10.3)
Chloride: 99 mmol/L (ref 96–106)
Creatinine, Ser: 0.86 mg/dL (ref 0.57–1.00)
GFR calc Af Amer: 79 mL/min/{1.73_m2} (ref >59)
GFR calc non Af Amer: 69 mL/min/{1.73_m2} (ref >59)
Globulin, Total: 2.4 g/dL (ref 1.5–4.5)
Glucose: 151 mg/dL — ABNORMAL HIGH (ref 65–99)
Potassium: 4.7 mmol/L (ref 3.5–5.2)
Sodium: 143 mmol/L (ref 134–144)
Total Protein: 7.3 g/dL (ref 6.0–8.5)

## 2020-08-05 LAB — LIPID PANEL WITH LDL/HDL RATIO
Cholesterol, Total: 141 mg/dL (ref 100–199)
HDL: 67 mg/dL (ref >39)
LDL Chol Calc (NIH): 60 mg/dL (ref 0–99)
LDL/HDL Ratio: 0.9 ratio (ref 0.0–3.2)
Triglycerides: 71 mg/dL (ref 0–149)
VLDL Cholesterol Cal: 14 mg/dL (ref 5–40)

## 2020-08-05 LAB — TSH: TSH: 7.5 u[IU]/mL — ABNORMAL HIGH (ref 0.450–4.500)

## 2020-08-08 ENCOUNTER — Telehealth: Payer: Self-pay

## 2020-08-08 DIAGNOSIS — E034 Atrophy of thyroid (acquired): Secondary | ICD-10-CM

## 2020-08-08 NOTE — Telephone Encounter (Signed)
Please review.  I called pt and scheduled her with Adriana on 02/06/2021  Thanks,   -Mickel Baas

## 2020-08-08 NOTE — Telephone Encounter (Signed)
Copied from [place] [id number]. Topic: General - Inquiry >> Aug 08, 2020 10:23 AM Gillis Ends D wrote: Reason for ESL:PNPYYFR called today with several concerns, first she got accu chek multiclix lancets called in to her pharmacy and she needs the Accu chek Soft clix lancets, she has already picked up the multiclix and she needs a new prescription sent to the pharmacy. Second she read in my chart that she was to start a levothyroxine and she wants to know if the prescription can be sent to the pharmacy also. Lastly she didn't receive a follow up appointment, I offered to schedule her an appointment but she said the doctor determines if it should be a 3 mo or 6 mo follow up. She knows that Dr. Marlyn Corporal is leaving and she wants to be scheduled with Dr. Terrilee Croak but she doesn't know how far out to schedule it. She can be reached at [phone]. Please advise

## 2020-08-09 MED ORDER — ACCU-CHEK SOFTCLIX LANCETS MISC: 12 refills | Status: AC

## 2020-08-09 MED ORDER — LEVOTHYROXINE SODIUM 25 MCG PO TABS
25.0000 ug | ORAL_TABLET | Freq: Every day | ORAL | 1 refills | Status: DC
Start: 2020-08-09 — End: 2020-09-21

## 2020-08-09 NOTE — Telephone Encounter (Signed)
Patient advised. TSH ordered.

## 2020-08-09 NOTE — Addendum Note (Signed)
Addended by: Mar Daring on: 08/09/2020 12:34 PM   Modules accepted: Orders

## 2020-08-09 NOTE — Addendum Note (Signed)
Addended by: Shawna Orleans on: 08/09/2020 01:20 PM   Modules accepted: Orders

## 2020-08-09 NOTE — Telephone Encounter (Signed)
Also sent in levothyroxine. Needs to have TSH rechecked in 6 weeks. Lab only, no appt needed

## 2020-08-09 NOTE — Telephone Encounter (Signed)
Sent in Soft clix lancets and agree with 6 month f/u

## 2020-09-21 ENCOUNTER — Other Ambulatory Visit: Payer: Self-pay | Admitting: Physician Assistant

## 2020-09-21 LAB — TSH: TSH: 6.54 u[IU]/mL — ABNORMAL HIGH (ref 0.450–4.500)

## 2020-09-21 MED ORDER — LEVOTHYROXINE SODIUM 50 MCG PO TABS: 50.0000 ug | ORAL_TABLET | Freq: Every day | ORAL | 3 refills | Status: DC

## 2020-10-05 ENCOUNTER — Other Ambulatory Visit: Payer: Self-pay | Admitting: Physician Assistant

## 2020-10-31 ENCOUNTER — Other Ambulatory Visit: Payer: Self-pay | Admitting: Physician Assistant

## 2020-10-31 DIAGNOSIS — I10 Essential (primary) hypertension: Secondary | ICD-10-CM

## 2020-11-07 ENCOUNTER — Ambulatory Visit: Payer: Medicare PPO | Attending: Internal Medicine

## 2020-11-07 DIAGNOSIS — Z23 Encounter for immunization: Secondary | ICD-10-CM

## 2020-11-07 NOTE — Progress Notes (Signed)
   Covid-19 Vaccination Clinic  Name:  Joyce Morton    MRN: [id number] DOB: 1951-05-03  11/07/2020  [name] was observed post Covid-19 immunization for 15 minutes without incident. She was provided with Vaccine Information Sheet and instruction to access the V-Safe system.   [name] was instructed to call 911 with any severe reactions post vaccine: Marland Kitchen Difficulty breathing  . Swelling of face and throat  . A fast heartbeat  . A bad rash all over body  . Dizziness and weakness   Immunizations Administered    Name Date Dose VIS Date Route   PFIZER Comrnaty(Gray TOP) Covid-19 Vaccine 11/07/2020 12:24 PM 0.3 mL 06/08/2020 Intramuscular   Manufacturer: [place]   Lot: [id number]   NDC: [place], PharmD, MBA Clinical Acute Care Pharmacist

## 2020-11-08 ENCOUNTER — Other Ambulatory Visit: Payer: Self-pay

## 2020-11-08 MED ORDER — PFIZER-BIONT COVID-19 VAC-TRIS 30 MCG/0.3ML IM SUSP
INTRAMUSCULAR | 0 refills | Status: DC
  Filled 2020-11-08: qty 0.3, 1d supply, fill #0

## 2020-11-20 ENCOUNTER — Telehealth: Payer: Self-pay

## 2020-11-20 NOTE — Telephone Encounter (Signed)
Copied from [place] [phone]. Topic: Appointment Scheduling - Scheduling Inquiry for Clinic >> Nov 20, 2020 11:01 AM [name] wrote: Reason for CRM: PT had tyroid blood work done on 3.23.22 and was suppose to have it rechecked in 6 weeks/ pt needs lab orders to come to lab this week for recheck / Pt also would like to ask Simona Huh if she can establish with him as a pt / please advise asap

## 2020-11-20 NOTE — Telephone Encounter (Signed)
Since she was seen by Tawanna Sat, I could see her until I retire.

## 2020-11-21 ENCOUNTER — Other Ambulatory Visit: Payer: Self-pay

## 2020-11-21 DIAGNOSIS — E039 Hypothyroidism, unspecified: Secondary | ICD-10-CM | POA: Insufficient documentation

## 2020-11-21 NOTE — Telephone Encounter (Signed)
Done

## 2020-11-30 LAB — TSH: TSH: 2.49 u[IU]/mL (ref 0.450–4.500)

## 2021-01-03 ENCOUNTER — Other Ambulatory Visit: Payer: Self-pay | Admitting: Physician Assistant

## 2021-01-15 ENCOUNTER — Other Ambulatory Visit: Payer: Self-pay | Admitting: Physician Assistant

## 2021-01-15 DIAGNOSIS — I10 Essential (primary) hypertension: Secondary | ICD-10-CM

## 2021-01-22 ENCOUNTER — Other Ambulatory Visit: Payer: Self-pay | Admitting: Family Medicine

## 2021-01-22 DIAGNOSIS — I10 Essential (primary) hypertension: Secondary | ICD-10-CM

## 2021-01-22 MED ORDER — CHLORTHALIDONE 25 MG PO TABS: 25.0000 mg | ORAL_TABLET | Freq: Every day | ORAL | 0 refills | Status: DC

## 2021-01-22 NOTE — Telephone Encounter (Signed)
Copied from [place] [id number]. Topic: Quick Communication - Rx Refill/Question >> Jan 22, 2021  8:33 AM Leward Quan A wrote: Medication: chlorthalidone (HYGROTON) 25 MG tablet  Please call patient with an answer when done   Has the patient contacted their pharmacy? Yes.   (Agent: If no, request that the patient contact the pharmacy for the refill.) (Agent: If yes, when and what did the pharmacy advise?)  Preferred Pharmacy (with phone number or street name): [facility] [place], [place] - [address] AT [address]  Phone:  [phone] Fax:  [phone]     Agent: Please be advised that RX refills may take up to 3 business days. We ask that you follow-up with your pharmacy.

## 2021-01-24 ENCOUNTER — Other Ambulatory Visit: Payer: Self-pay

## 2021-01-24 ENCOUNTER — Telehealth: Payer: Self-pay | Admitting: Physician Assistant

## 2021-01-24 DIAGNOSIS — I10 Essential (primary) hypertension: Secondary | ICD-10-CM

## 2021-01-24 MED ORDER — LOSARTAN POTASSIUM 100 MG PO TABS: ORAL_TABLET | ORAL | 0 refills | Status: DC

## 2021-01-24 NOTE — Telephone Encounter (Signed)
[facility] faxed refill request for the following medications:    losartan (COZAAR) 100 MG tablet   Please advise.

## 2021-02-06 ENCOUNTER — Ambulatory Visit: Payer: Medicare PPO | Admitting: Family Medicine

## 2021-02-06 ENCOUNTER — Ambulatory Visit: Payer: Medicare PPO | Admitting: Physician Assistant

## 2021-02-15 LAB — HM DIABETES EYE EXAM

## 2021-02-23 ENCOUNTER — Telehealth: Payer: Self-pay

## 2021-02-23 NOTE — Telephone Encounter (Signed)
Can you please contact patient and schedule appt,I am unable to double book schedule. KW

## 2021-02-23 NOTE — Telephone Encounter (Signed)
Pt called back to rescheduled her appointment 03/06/2021. I could not find any appointments until the end of October.  I offered to schedule Ms Joyce Morton with a different provider she refused and stated "I only want to see Dr. Brita Romp."  Is there a way you can work her in sooner?     Thanks,   -Mickel Baas

## 2021-02-23 NOTE — Telephone Encounter (Signed)
Any appt with ok for 20 or ok to double book can be used. If there are none, then there are none. I do not need to get these messages. Futhermore, I am unable to take on any new patients fulltime. I am seeing them while our new hires get going, but she will eventually need to see another provider anyways

## 2021-03-06 ENCOUNTER — Ambulatory Visit: Payer: Medicare PPO | Admitting: Family Medicine

## 2021-03-09 ENCOUNTER — Ambulatory Visit: Payer: Medicare PPO | Admitting: Family Medicine

## 2021-03-09 ENCOUNTER — Other Ambulatory Visit: Payer: Self-pay

## 2021-03-09 ENCOUNTER — Encounter: Payer: Self-pay | Admitting: Family Medicine

## 2021-03-09 VITALS — BP 125/80 | HR 81 | Temp 98.0°F | Resp 16 | Ht 64.0 in | Wt 187.9 lb

## 2021-03-09 DIAGNOSIS — E1169 Type 2 diabetes mellitus with other specified complication: Secondary | ICD-10-CM

## 2021-03-09 DIAGNOSIS — E039 Hypothyroidism, unspecified: Secondary | ICD-10-CM | POA: Diagnosis not present

## 2021-03-09 DIAGNOSIS — E1159 Type 2 diabetes mellitus with other circulatory complications: Secondary | ICD-10-CM

## 2021-03-09 DIAGNOSIS — E785 Hyperlipidemia, unspecified: Secondary | ICD-10-CM

## 2021-03-09 DIAGNOSIS — I152 Hypertension secondary to endocrine disorders: Secondary | ICD-10-CM

## 2021-03-09 LAB — POCT GLYCOSYLATED HEMOGLOBIN (HGB A1C)
Est. average glucose Bld gHb Est-mCnc: 128
Hemoglobin A1C: 6.1 % — AB (ref 4.0–5.6)

## 2021-03-09 NOTE — Assessment & Plan Note (Signed)
Previously well controlled Continue Synthroid at current dose  Recheck TSH and adjust Synthroid as indicated   

## 2021-03-09 NOTE — Assessment & Plan Note (Signed)
Well controlled on home readings Continue current medications Recheck metabolic panel F/u in 6 months  

## 2021-03-09 NOTE — Assessment & Plan Note (Signed)
Previously well controlled Continue statin Repeat FLP and CMP Goal LDL < 70 

## 2021-03-09 NOTE — Assessment & Plan Note (Signed)
Noted on last labs Stopped Ca supplement Still on MVI Recheck If still elevated, check ionized Ca and PTH

## 2021-03-09 NOTE — Progress Notes (Signed)
Established patient visit   Patient: Joyce Morton   DOB: 06-17-1951   70 y.o. Female  MRN: [id number] Visit Date: 03/09/2021  Today's healthcare provider: Lavon Paganini, MD   Chief Complaint  Patient presents with   Diabetes   Hypertension   Hyperlipidemia   Hypothyroidism   Subjective    Diabetes Hypoglycemia symptoms include nervousness/anxiousness. Pertinent negatives for hypoglycemia include no dizziness or headaches. Pertinent negatives for diabetes include no chest pain, no fatigue and no weakness.  Hypertension Pertinent negatives include no chest pain, headaches, neck pain, palpitations or shortness of breath.  Hyperlipidemia Pertinent negatives include no chest pain, myalgias or shortness of breath.    Diabetes Mellitus Type II, follow-up  Lab Results  Component Value Date   HGBA1C 6.1 (A) 03/09/2021   HGBA1C 6.5 (H) 08/04/2020   HGBA1C 6.7 (A) 12/27/2019   Last seen for diabetes 6 months ago.  Management since then includes continuing the same treatment.  She reports excellent compliance with treatment. She is not having side effects.   Tolerating metformin 1000 mg and denies adverse affects.  Home blood sugar records: fasting range: 120-130  Episodes of hypoglycemia? No    Current insulin regiment: none Most Recent Eye Exam: UTD [facility]  --------------------------------------------------------------------------------------------------- Hypertension, follow-up  BP Readings from Last 3 Encounters:  03/09/21 125/80  08/04/20 (!) 152/86  12/27/19 118/83   Wt Readings from Last 3 Encounters:  03/09/21 187 lb 14.4 oz (85.2 kg)  08/04/20 185 lb (83.9 kg)  12/27/19 193 lb (87.5 kg)     She was last seen for hypertension 6 months ago.   BP at that visit was 152/86. Management since that visit includes no changes.  She reports excellent compliance with treatment. She is not having side effects.  She is exercising. She is not  adherent to low salt diet.    Outside blood pressures are stable however her range fluctuates. She will continue monitoring at home.   She does not smoke.  Use of agents associated with hypertension: none.   --------------------------------------------------------------------------------------------------- Lipid/Cholesterol, follow-up  Last Lipid Panel: Lab Results  Component Value Date   CHOL 141 08/04/2020   LDLCALC 60 08/04/2020   HDL 67 08/04/2020   TRIG 71 08/04/2020    She was last seen for this 6 months ago.  Management since that visit includes no changes.  She reports excellent compliance with treatment. She is not having side effects.   Symptoms: No appetite changes No foot ulcerations  No chest pain No chest pressure/discomfort  No dyspnea No orthopnea  No fatigue No lower extremity edema  No palpitations No paroxysmal nocturnal dyspnea  No nausea No numbness or tingling of extremity  No polydipsia No polyuria  No speech difficulty No syncope   She is following a Regular diet. Current exercise: walking  Last metabolic panel Lab Results  Component Value Date   GLUCOSE 151 (H) 08/04/2020   NA 143 08/04/2020   K 4.7 08/04/2020   BUN 23 08/04/2020   CREATININE 0.86 08/04/2020   GFRNONAA 69 08/04/2020   GFRAA 79 08/04/2020   CALCIUM 10.9 (H) 08/04/2020   AST 25 08/04/2020   ALT 30 08/04/2020   The 10-year ASCVD risk score (Arnett DK, et al., 2019) is: 21.8%  --------------------------------------------------------------------------------------------------- Hypothyroid, follow-up  Lab Results  Component Value Date   TSH 2.490 11/29/2020   TSH 6.540 (H) 09/20/2020   TSH 7.500 (H) 08/04/2020   Wt Readings from Last 3 Encounters:  03/09/21 187 lb 14.4 oz (85.2 kg)  08/04/20 185 lb (83.9 kg)  12/27/19 193 lb (87.5 kg)    She was last seen for hypothyroid 6 months ago.   Management since that visit includes start Levothyroxine 61mg, recheck  lab. Lab rechecked on 09/20/20 Levothyroxine increased to 549m. TSH lab normal on 11/29/20, continue Levothyroxine 5088m  She reports excellent compliance with treatment. She is not having side effects.   Symptoms: Yes change in energy level No constipation  No diarrhea Yes heat / cold intolerance  Yes nervousness No palpitations  No weight changes    She lost her mother in July and she reports she is still grieving and this is secondary to her irritability and spikes in blood pressure.   She had an eye exam 2 weeks ago and she is planning for cataract surgery in January. She sees Dr. JacCharline Billssion   She declines the flu vaccine at this time -----------------------------------------------------------------------------------------     Medications: Outpatient Medications Prior to Visit  Medication Sig   Accu-Chek Softclix Lancets lancets Use as instructed   atorvastatin (LIPITOR) 40 MG tablet Take 1 tablet (40 mg total) by mouth daily at 6 PM.   blood glucose meter kit and supplies KIT Dispense based on patient and insurance preference. Use 2-4 x weekly.   chlorthalidone (HYGROTON) 25 MG tablet Take 1 tablet (25 mg total) by mouth daily.   famotidine (PEPCID) 10 MG tablet Take 10 mg by mouth daily.   glucose blood (ACCU-CHEK AVIVA PLUS) test strip To check blood sugar daily   levothyroxine (SYNTHROID) 50 MCG tablet Take 1 tablet (50 mcg total) by mouth daily.   losartan (COZAAR) 100 MG tablet TAKE 1 TABLET(100 MG) BY MOUTH DAILY   metFORMIN (GLUCOPHAGE) 1000 MG tablet TAKE 1 TABLET(1000 MG) BY MOUTH TWICE DAILY WITH A MEAL   Multiple Vitamin (MULTIVITAMIN) tablet Take 1 tablet by mouth daily.   nystatin cream (MYCOSTATIN) Apply 1 application topically 2 (two) times daily.   [DISCONTINUED] aspirin 81 MG tablet Take 81 mg by mouth daily.   [DISCONTINUED] CALCIUM PO Take by mouth daily.   [DISCONTINUED] COVID-19 mRNA Vac-TriS, Pfizer, (PFIZER-BIONT COVID-19 VAC-TRIS) SUSP  injection Inject into the muscle.   No facility-administered medications prior to visit.    Review of Systems  Constitutional:  Negative for activity change, appetite change, chills, fatigue, fever and unexpected weight change.  HENT:  Negative for ear pain, sinus pressure, sinus pain and sore throat.   Eyes:  Negative for visual disturbance.  Respiratory:  Negative for cough, chest tightness, shortness of breath and wheezing.   Cardiovascular:  Negative for chest pain, palpitations and leg swelling.  Gastrointestinal:  Negative for abdominal pain, constipation, diarrhea, nausea and vomiting.  Genitourinary:  Negative for flank pain, frequency and urgency.  Musculoskeletal:  Negative for back pain, myalgias and neck pain.  Neurological:  Negative for dizziness, weakness, light-headedness, numbness and headaches.  Psychiatric/Behavioral:  Positive for sleep disturbance. The patient is nervous/anxious.       Objective    BP 125/80 Comment: home reading  Pulse 81   Temp 98 F (36.7 C) (Oral)   Resp 16   Ht _0  (1.626 m)   Wt 187 lb 14.4 oz (85.2 kg)   SpO2 96%   BMI 32.25 kg/m  {Show previous vital signs (optional):23777}  Physical Exam Vitals reviewed.  Constitutional:      General: She is not in acute distress.    Appearance: Normal appearance. She is  well-developed. She is not diaphoretic.  HENT:     Head: Normocephalic and atraumatic.  Eyes:     General: No scleral icterus.    Conjunctiva/sclera: Conjunctivae normal.  Neck:     Thyroid: No thyromegaly.  Cardiovascular:     Rate and Rhythm: Normal rate and regular rhythm.     Pulses: Normal pulses.     Heart sounds: Normal heart sounds. No murmur heard. Pulmonary:     Effort: Pulmonary effort is normal. No respiratory distress.     Breath sounds: Normal breath sounds. No wheezing, rhonchi or rales.  Musculoskeletal:     Cervical back: Neck supple.     Right lower leg: No edema.     Left lower leg: No edema.   Feet:     Comments: DIABETIC FOOT EXAM: MONOFILAMENT TEST NORMAL IN BILATERAL FEET. PULSES INTACT. NO SORES. NORMAL CAPILLARY REFILL.   Lymphadenopathy:     Cervical: No cervical adenopathy.  Skin:    General: Skin is warm and dry.     Findings: No rash.  Neurological:     Mental Status: She is alert and oriented to person, place, and time. Mental status is at baseline.  Psychiatric:        Mood and Affect: Mood normal.        Behavior: Behavior normal.      Results for orders placed or performed in visit on 03/09/21  POCT glycosylated hemoglobin (Hb A1C)  Result Value Ref Range   Hemoglobin A1C 6.1 (A) 4.0 - 5.6 %   Est. average glucose Bld gHb Est-mCnc 128     Assessment & Plan     Problem List Items Addressed This Visit       Cardiovascular and Mediastinum   Hypertension associated with diabetes ([place]) - Primary    Well controlled on home readings Continue current medications Recheck metabolic panel F/u in 6 months       Relevant Orders   Comprehensive metabolic panel     Endocrine   Hyperlipidemia associated with type 2 diabetes mellitus ([place])    Previously well controlled Continue statin Repeat FLP and CMP Goal LDL < 70      Relevant Orders   Lipid Panel With LDL/HDL Ratio   Diabetes mellitus, type II ([place])    Well controlled  Continue current medications UTD on vaccines, eye exam (ROI), foot exam (done today) On ARB On Statin Discussed diet and exercise F/u in 6 months       Relevant Orders   POCT glycosylated hemoglobin (Hb A1C) (Completed)   Hypothyroidism    Previously well controlled Continue Synthroid at current dose  Recheck TSH and adjust Synthroid as indicated       Relevant Orders   TSH     Other   Hypercalcemia    Noted on last labs Stopped Ca supplement Still on MVI Recheck If still elevated, check ionized Ca and PTH       Return in about 6 months (around 09/06/2021) for AWV, CPE.      I,Essence Turner,acting as a  scribe for Lavon Paganini, MD.,have documented all relevant documentation on the behalf of Lavon Paganini, MD,as directed by  Lavon Paganini, MD while in the presence of Lavon Paganini, MD.  I, Lavon Paganini, MD, have reviewed all documentation for this visit. The documentation on 03/09/21 for the exam, diagnosis, procedures, and orders are all accurate and complete.   Elani Delph, [name], MD, MPH [facility]

## 2021-03-09 NOTE — Assessment & Plan Note (Signed)
Well controlled  Continue current medications UTD on vaccines, eye exam (ROI), foot exam (done today) On ARB On Statin Discussed diet and exercise F/u in 6 months

## 2021-03-10 LAB — COMPREHENSIVE METABOLIC PANEL
ALT: 29 IU/L (ref 0–32)
AST: 25 IU/L (ref 0–40)
Albumin/Globulin Ratio: 2.1 (ref 1.2–2.2)
Albumin: 4.9 g/dL — ABNORMAL HIGH (ref 3.7–4.7)
Alkaline Phosphatase: 66 IU/L (ref 44–121)
BUN/Creatinine Ratio: 19 (ref 12–28)
BUN: 18 mg/dL (ref 8–27)
Bilirubin Total: 0.9 mg/dL (ref 0.0–1.2)
CO2: 26 mmol/L (ref 20–29)
Calcium: 10.4 mg/dL — ABNORMAL HIGH (ref 8.7–10.3)
Chloride: 96 mmol/L (ref 96–106)
Creatinine, Ser: 0.96 mg/dL (ref 0.57–1.00)
Globulin, Total: 2.3 g/dL (ref 1.5–4.5)
Glucose: 147 mg/dL — ABNORMAL HIGH (ref 65–99)
Potassium: 4.4 mmol/L (ref 3.5–5.2)
Sodium: 140 mmol/L (ref 134–144)
Total Protein: 7.2 g/dL (ref 6.0–8.5)
eGFR: 63 mL/min/{1.73_m2} (ref >59)

## 2021-03-10 LAB — LIPID PANEL WITH LDL/HDL RATIO
Cholesterol, Total: 151 mg/dL (ref 100–199)
HDL: 69 mg/dL (ref >39)
LDL Chol Calc (NIH): 64 mg/dL (ref 0–99)
LDL/HDL Ratio: 0.9 ratio (ref 0.0–3.2)
Triglycerides: 99 mg/dL (ref 0–149)
VLDL Cholesterol Cal: 18 mg/dL (ref 5–40)

## 2021-03-10 LAB — TSH: TSH: 3.29 u[IU]/mL (ref 0.450–4.500)

## 2021-03-17 LAB — PTH, INTACT AND CALCIUM: Calcium: 10.3 mg/dL (ref 8.7–10.3)

## 2021-03-17 LAB — SPECIMEN STATUS REPORT

## 2021-04-06 ENCOUNTER — Other Ambulatory Visit: Payer: Self-pay | Admitting: Family Medicine

## 2021-04-06 DIAGNOSIS — I10 Essential (primary) hypertension: Secondary | ICD-10-CM

## 2021-04-06 NOTE — Telephone Encounter (Signed)
Requested Prescriptions  Pending Prescriptions Disp Refills  . metFORMIN (GLUCOPHAGE) 1000 MG tablet [Pharmacy Med Name: METFORMIN 1000MG TABLETS] 180 tablet 1    Sig: TAKE 1 TABLET(1000 MG) BY MOUTH TWICE DAILY WITH A MEAL     Endocrinology:  Diabetes - Biguanides Passed - 04/06/2021  7:13 AM      Passed - Cr in normal range and within 360 days    Creatinine, Ser  Date Value Ref Range Status  03/09/2021 0.96 0.57 - 1.00 mg/dL Final         Passed - HBA1C is between 0 and 7.9 and within 180 days    Hemoglobin A1C  Date Value Ref Range Status  03/09/2021 6.1 (A) 4.0 - 5.6 % Final  12/03/2016 7.0  Final   Hgb A1c MFr Bld  Date Value Ref Range Status  08/04/2020 6.5 (H) 4.8 - 5.6 % Final    Comment:             Prediabetes: 5.7 - 6.4          Diabetes: >6.4          Glycemic control for adults with diabetes: <7.0          Passed - eGFR in normal range and within 360 days    GFR calc Af Amer  Date Value Ref Range Status  08/04/2020 79 >59 mL/min/1.73 Final    Comment:    **In accordance with recommendations from the NKF-ASN Task force,**   [facility] is in the process of updating its eGFR calculation to the   2021 CKD-EPI creatinine equation that estimates kidney function   without a race variable.    GFR calc non Af Amer  Date Value Ref Range Status  08/04/2020 69 >59 mL/min/1.73 Final   eGFR  Date Value Ref Range Status  03/09/2021 63 >59 mL/min/1.73 Final         Passed - Valid encounter within last 6 months    Recent Outpatient Visits          4 weeks ago Hypertension associated with diabetes [facility])   [facility] [place], [name], [place]   8 months ago Medicare annual wellness visit, subsequent   [place], [place], [place]   1 year ago Type 2 diabetes mellitus without complication, without long-term current use of insulin [facility])   [place], [place], [place]   1 year ago Hypertension, essential    [place], [place]   1 year ago Annual physical exam   [place], Anderson Malta M, [place]             . chlorthalidone (HYGROTON) 25 MG tablet [Pharmacy Med Name: CHLORTHALIDONE 25MG TABLETS] 90 tablet 1    Sig: TAKE 1 TABLET(25 MG) BY MOUTH DAILY     Cardiovascular: Diuretics - Thiazide Passed - 04/06/2021  7:13 AM      Passed - Ca in normal range and within 360 days    Calcium  Date Value Ref Range Status  03/09/2021 10.4 (H) 8.7 - 10.3 mg/dL Final  03/09/2021 10.3 8.7 - 10.3 mg/dL Final         Passed - Cr in normal range and within 360 days    Creatinine, Ser  Date Value Ref Range Status  03/09/2021 0.96 0.57 - 1.00 mg/dL Final         Passed - K in normal range and within 360 days    Potassium  Date Value Ref Range Status  03/09/2021 4.4 3.5 - 5.2 mmol/L Final         Passed - Na in normal range and within 360 days    Sodium  Date Value Ref Range Status  03/09/2021 140 134 - 144 mmol/L Final         Passed - Last BP in normal range    BP Readings from Last 1 Encounters:  03/09/21 125/80         Passed - Valid encounter within last 6 months    Recent Outpatient Visits          4 weeks ago Hypertension associated with diabetes [facility])   [facility] [place], [name], [place]   8 months ago Medicare annual wellness visit, subsequent   [place], [place], [place]   1 year ago Type 2 diabetes mellitus without complication, without long-term current use of insulin [facility])   [place], [place], [place]   1 year ago Hypertension, essential   [facility], [name], [place]   1 year ago Annual physical exam   [facility] [place], [place], [place]

## 2021-04-06 NOTE — Telephone Encounter (Signed)
Requested Prescriptions  Pending Prescriptions Disp Refills  . losartan (COZAAR) 100 MG tablet [Pharmacy Med Name: LOSARTAN 100MG  TABLETS] 90 tablet 0    Sig: TAKE 1 TABLET(100 MG) BY MOUTH DAILY     Cardiovascular:  Angiotensin Receptor Blockers Passed - 04/06/2021  7:13 AM      Passed - Cr in normal range and within 180 days    Creatinine, Ser  Date Value Ref Range Status  03/09/2021 0.96 0.57 - 1.00 mg/dL Final         Passed - K in normal range and within 180 days    Potassium  Date Value Ref Range Status  03/09/2021 4.4 3.5 - 5.2 mmol/L Final         Passed - Patient is not pregnant      Passed - Last BP in normal range    BP Readings from Last 1 Encounters:  03/09/21 125/80         Passed - Valid encounter within last 6 months    Recent Outpatient Visits          4 weeks ago Hypertension associated with diabetes [facility])   [facility] [place], [name], [place]   8 months ago Medicare annual wellness visit, subsequent   [place], [place], [place]   1 year ago Type 2 diabetes mellitus without complication, without long-term current use of insulin [facility])   [place], [place], [place]   1 year ago Hypertension, essential   [facility], [name], [place]   1 year ago Annual physical exam   [facility] [place], [place], [place]

## 2021-07-01 HISTORY — PX: CATARACT EXTRACTION, BILATERAL: SHX1313

## 2021-07-04 DIAGNOSIS — H2511 Age-related nuclear cataract, right eye: Secondary | ICD-10-CM | POA: Diagnosis not present

## 2021-07-04 DIAGNOSIS — H52221 Regular astigmatism, right eye: Secondary | ICD-10-CM | POA: Diagnosis not present

## 2021-07-04 DIAGNOSIS — H52201 Unspecified astigmatism, right eye: Secondary | ICD-10-CM | POA: Diagnosis not present

## 2021-07-04 DIAGNOSIS — H25811 Combined forms of age-related cataract, right eye: Secondary | ICD-10-CM | POA: Diagnosis not present

## 2021-07-18 DIAGNOSIS — Z7984 Long term (current) use of oral hypoglycemic drugs: Secondary | ICD-10-CM | POA: Diagnosis not present

## 2021-07-18 DIAGNOSIS — H2512 Age-related nuclear cataract, left eye: Secondary | ICD-10-CM | POA: Diagnosis not present

## 2021-07-18 DIAGNOSIS — H25812 Combined forms of age-related cataract, left eye: Secondary | ICD-10-CM | POA: Diagnosis not present

## 2021-07-18 DIAGNOSIS — E119 Type 2 diabetes mellitus without complications: Secondary | ICD-10-CM | POA: Diagnosis not present

## 2021-07-18 DIAGNOSIS — H52202 Unspecified astigmatism, left eye: Secondary | ICD-10-CM | POA: Diagnosis not present

## 2021-07-18 DIAGNOSIS — I10 Essential (primary) hypertension: Secondary | ICD-10-CM | POA: Diagnosis not present

## 2021-07-23 ENCOUNTER — Other Ambulatory Visit: Payer: Self-pay

## 2021-07-23 ENCOUNTER — Telehealth: Payer: Self-pay | Admitting: Family Medicine

## 2021-07-23 DIAGNOSIS — E1169 Type 2 diabetes mellitus with other specified complication: Secondary | ICD-10-CM

## 2021-07-23 MED ORDER — ATORVASTATIN CALCIUM 40 MG PO TABS: 40.0000 mg | ORAL_TABLET | Freq: Every day | ORAL | 1 refills | Status: DC

## 2021-07-23 NOTE — Telephone Encounter (Signed)
[facility] faxed refill request for the following medications:  atorvastatin (LIPITOR) 40 MG tablet   Please advise.  

## 2021-08-20 ENCOUNTER — Other Ambulatory Visit: Payer: Self-pay | Admitting: Family Medicine

## 2021-08-20 DIAGNOSIS — I10 Essential (primary) hypertension: Secondary | ICD-10-CM

## 2021-08-21 NOTE — Telephone Encounter (Signed)
Requested Prescriptions  Pending Prescriptions Disp Refills   losartan (COZAAR) 100 MG tablet [Pharmacy Med Name: LOSARTAN 100MG  TABLETS] 90 tablet 0    Sig: TAKE 1 TABLET(100 MG) BY MOUTH DAILY     Cardiovascular:  Angiotensin Receptor Blockers Passed - 08/20/2021  4:53 PM      Passed - Cr in normal range and within 180 days    Creatinine, Ser  Date Value Ref Range Status  03/09/2021 0.96 0.57 - 1.00 mg/dL Final         Passed - K in normal range and within 180 days    Potassium  Date Value Ref Range Status  03/09/2021 4.4 3.5 - 5.2 mmol/L Final         Passed - Patient is not pregnant      Passed - Last BP in normal range    BP Readings from Last 1 Encounters:  03/09/21 125/80         Passed - Valid encounter within last 6 months    Recent Outpatient Visits          5 months ago Hypertension associated with diabetes [facility])   [facility] [name], [name], MD   1 year ago Medicare annual wellness visit, subsequent   [name], [name], [place]   1 year ago Type 2 diabetes mellitus without complication, without long-term current use of insulin [facility])   [name], [name], [place]   1 year ago Hypertension, essential   [facility], [name], [place]   2 years ago Annual physical exam   [facility] [name], [name], [place]

## 2021-10-11 ENCOUNTER — Other Ambulatory Visit: Payer: Self-pay

## 2021-10-11 ENCOUNTER — Telehealth: Payer: Self-pay | Admitting: Gastroenterology

## 2021-10-11 DIAGNOSIS — Z86010 Personal history of colonic polyps: Secondary | ICD-10-CM

## 2021-10-11 MED ORDER — CLENPIQ 10-3.5-12 MG-GM -GM/160ML PO SOLN: 1.0000 | ORAL | 0 refills | Status: AC

## 2021-10-11 NOTE — Progress Notes (Signed)
Gastroenterology Pre-Procedure Review ? ?Request Date: Thursday 01/10/2022 ? ?Requesting Physician: Dr. Allen Norris ? ?PATIENT REVIEW QUESTIONS: The patient responded to the following health history questions as indicated:   ? ?1. Are you having any GI issues? no ?2. Do you have a personal history of Polyps? Yes last colonoscopy 01/16/17 with Dr. Allen Norris noted polyps ?3. Do you have a family history of Colon Cancer or Polyps? no ?4. Diabetes Mellitus? Yes Type 2 diabetic ?5. Joint replacements in the past 12 months?no ?6. Major health problems in the past 3 months? January 2023 Cataract Surgery ?7. Any artificial heart valves, MVP, or defibrillator?no ?   ?MEDICATIONS & ALLERGIES:    ?Patient reports the following regarding taking any anticoagulation/antiplatelet therapy:   ?Plavix, Coumadin, Eliquis, Xarelto, Lovenox, Pradaxa, Brilinta, or Effient? no ?Aspirin? no ? ?Patient confirms/reports the following medications:  ?Current Outpatient Medications  ?Medication Sig Dispense Refill  ? Accu-Chek Softclix Lancets lancets Use as instructed 100 each 12  ? atorvastatin (LIPITOR) 40 MG tablet Take 1 tablet (40 mg total) by mouth daily at 6 PM. 90 tablet 1  ? blood glucose meter kit and supplies KIT Dispense based on patient and insurance preference. Use 2-4 x weekly. 1 each 0  ? chlorthalidone (HYGROTON) 25 MG tablet TAKE 1 TABLET(25 MG) BY MOUTH DAILY 90 tablet 1  ? famotidine (PEPCID) 10 MG tablet Take 10 mg by mouth daily.    ? glucose blood (ACCU-CHEK AVIVA PLUS) test strip To check blood sugar daily 100 each 12  ? levothyroxine (SYNTHROID) 50 MCG tablet Take 1 tablet (50 mcg total) by mouth daily. 90 tablet 3  ? losartan (COZAAR) 100 MG tablet TAKE 1 TABLET(100 MG) BY MOUTH DAILY 90 tablet 0  ? metFORMIN (GLUCOPHAGE) 1000 MG tablet TAKE 1 TABLET(1000 MG) BY MOUTH TWICE DAILY WITH A MEAL 180 tablet 1  ? Multiple Vitamin (MULTIVITAMIN) tablet Take 1 tablet by mouth daily.    ? nystatin cream (MYCOSTATIN) Apply 1 application  topically 2 (two) times daily. 30 g 0  ? ?No current facility-administered medications for this visit.  ? ? ?Patient confirms/reports the following allergies:  ?Allergies  ?Allergen Reactions  ? Lisinopril Cough  ? Wellbutrin [Bupropion] Hives  ? ? ?No orders of the defined types were placed in this encounter. ? ? ?AUTHORIZATION INFORMATION ?Primary Insurance: ?1D#: ?Group #: ? ?Secondary Insurance: ?1D#: ?Group #: ? ?SCHEDULE INFORMATION: ?Date: 01/10/22 ?Time: ?Location: [place] ?

## 2021-10-11 NOTE — Telephone Encounter (Signed)
Pt calls advising she received notification through mychart is was time for repeat colonoscopy, she is wanting to schedule, last procedure was in 2018 ? ?Pt wants to know if there is another option for prepping for procedure other than liquid that was called in 2018,  she does not remember med ?

## 2021-10-12 NOTE — Progress Notes (Signed)
? ? ?I,Sulibeya S Dimas,acting as a scribe for Lavon Paganini, MD.,have documented all relevant documentation on the behalf of Lavon Paganini, MD,as directed by  Lavon Paganini, MD while in the presence of Lavon Paganini, MD. ? ? ?Annual Wellness Visit ? ?  ? ?Patient: Joyce Morton, Female    DOB: [birth year], 71 y.o.   MRN: [id number] ?Visit Date: 10/15/2021 ? ?Today's Provider: Lavon Paganini, MD  ? ?Chief Complaint  ?Patient presents with  ? Medicare Wellness  ? ?Subjective  ?  ?PAULETTA PICKNEY is a 71 y.o. female who presents today for her Annual Wellness Visit. ?She reports consuming a general diet. Home exercise routine includes walking 1.5 hrs per week. She generally feels fairly well. She reports sleeping well. She does not have additional problems to discuss today.  ? ?HPI ? ? ?Medications: ?Outpatient Medications Prior to Visit  ?Medication Sig  ? Accu-Chek Softclix Lancets lancets Use as instructed  ? atorvastatin (LIPITOR) 40 MG tablet Take 1 tablet (40 mg total) by mouth daily at 6 PM.  ? blood glucose meter kit and supplies KIT Dispense based on patient and insurance preference. Use 2-4 x weekly.  ? chlorthalidone (HYGROTON) 25 MG tablet TAKE 1 TABLET(25 MG) BY MOUTH DAILY  ? famotidine (PEPCID) 10 MG tablet Take 10 mg by mouth daily.  ? glucose blood (ACCU-CHEK AVIVA PLUS) test strip To check blood sugar daily  ? levothyroxine (SYNTHROID) 50 MCG tablet Take 1 tablet (50 mcg total) by mouth daily.  ? losartan (COZAAR) 100 MG tablet TAKE 1 TABLET(100 MG) BY MOUTH DAILY  ? metFORMIN (GLUCOPHAGE) 1000 MG tablet TAKE 1 TABLET(1000 MG) BY MOUTH TWICE DAILY WITH A MEAL  ? Multiple Vitamin (MULTIVITAMIN) tablet Take 1 tablet by mouth daily.  ? nystatin cream (MYCOSTATIN) Apply 1 application topically 2 (two) times daily.  ? ?No facility-administered medications prior to visit.  ?  ?Allergies  ?Allergen Reactions  ? Lisinopril Cough and Other (See Comments)  ?  Patient unsure what  happens when she takes it  ? Wellbutrin [Bupropion] Hives  ? ? ?Patient Care Team: ?[place]ia Crews, MD as PCP - General (Family Medicine) ? ?Review of Systems  ?Endocrine: Positive for heat intolerance.  ?Musculoskeletal:  Positive for arthralgias.  ? ?Last CBC ?Lab Results  ?Component Value Date  ? WBC 9.2 08/04/2020  ? HGB 11.6 08/04/2020  ? HCT 37.2 08/04/2020  ? MCV 60 (L) 08/04/2020  ? MCH 18.8 (L) 08/04/2020  ? RDW 17.9 (H) 08/04/2020  ? PLT 482 (H) 08/04/2020  ? ?Last metabolic panel ?Lab Results  ?Component Value Date  ? GLUCOSE 147 (H) 03/09/2021  ? NA 140 03/09/2021  ? K 4.4 03/09/2021  ? CL 96 03/09/2021  ? CO2 26 03/09/2021  ? BUN 18 03/09/2021  ? CREATININE 0.96 03/09/2021  ? EGFR 63 03/09/2021  ? CALCIUM 10.4 (H) 03/09/2021  ? CALCIUM 10.3 03/09/2021  ? PROT 7.2 03/09/2021  ? ALBUMIN 4.9 (H) 03/09/2021  ? LABGLOB 2.3 03/09/2021  ? AGRATIO 2.1 03/09/2021  ? BILITOT 0.9 03/09/2021  ? ALKPHOS 66 03/09/2021  ? AST 25 03/09/2021  ? ALT 29 03/09/2021  ? ?Last lipids ?Lab Results  ?Component Value Date  ? CHOL 151 03/09/2021  ? HDL 69 03/09/2021  ? [place] 64 03/09/2021  ? TRIG 99 03/09/2021  ? CHOLHDL 1.8 06/03/2019  ? ?Last hemoglobin A1c ?Lab Results  ?Component Value Date  ? HGBA1C 6.1 (A) 03/09/2021  ? ?Last thyroid functions ?Lab Results  ?Component  Value Date  ? TSH 3.290 03/09/2021  ? ?  ?  ? Objective  ?  ?Vitals: BP 125/80 Comment: home reading  Pulse 91   Temp 98.5 ?F (36.9 ?C) (Oral)   Resp 16   Ht 5' 3"  (1.6 m)   Wt 192 lb (87.1 kg)   SpO2 99%   BMI 34.01 kg/m?  ?BP Readings from Last 3 Encounters:  ?10/15/21 125/80  ?03/09/21 125/80  ?08/04/20 (!) 152/86  ? ?Wt Readings from Last 3 Encounters:  ?10/15/21 192 lb (87.1 kg)  ?03/09/21 187 lb 14.4 oz (85.2 kg)  ?08/04/20 185 lb (83.9 kg)  ? ?  ? ?Physical Exam ?Vitals reviewed.  ?Constitutional:   ?   General: She is not in acute distress. ?   Appearance: Normal appearance. She is well-developed. She is not diaphoretic.  ?HENT:  ?    Head: Normocephalic and atraumatic.  ?   Right Ear: Tympanic membrane, ear canal and external ear normal.  ?   Left Ear: Tympanic membrane, ear canal and external ear normal.  ?   Nose: Nose normal.  ?   Mouth/Throat:  ?   Mouth: Mucous membranes are moist.  ?   Pharynx: Oropharynx is clear. No oropharyngeal exudate.  ?Eyes:  ?   General: No scleral icterus. ?   Conjunctiva/sclera: Conjunctivae normal.  ?   Pupils: Pupils are equal, round, and reactive to light.  ?Neck:  ?   Thyroid: No thyromegaly.  ?Cardiovascular:  ?   Rate and Rhythm: Normal rate and regular rhythm.  ?   Pulses: Normal pulses.  ?   Heart sounds: Normal heart sounds. No murmur heard. ?Pulmonary:  ?   Effort: Pulmonary effort is normal. No respiratory distress.  ?   Breath sounds: Normal breath sounds. No wheezing or rales.  ?Abdominal:  ?   General: There is no distension.  ?   Palpations: Abdomen is soft.  ?   Tenderness: There is no abdominal tenderness.  ?Musculoskeletal:     ?   General: No deformity.  ?   Cervical back: Neck supple.  ?   Right lower leg: No edema.  ?   Left lower leg: No edema.  ?Lymphadenopathy:  ?   Cervical: No cervical adenopathy.  ?Skin: ?   General: Skin is warm and dry.  ?Neurological:  ?   Mental Status: She is alert and oriented to person, place, and time. Mental status is at baseline.  ?   Gait: Gait normal.  ?Psychiatric:     ?   Mood and Affect: Mood normal.     ?   Behavior: Behavior normal.     ?   Thought Content: Thought content normal.  ? ? ? ?Most recent functional status assessment: ? ?  10/15/2021  ? 10:07 AM  ?In your present state of health, do you have any difficulty performing the following activities:  ?Hearing? 0  ?Vision? 0  ?Difficulty concentrating or making decisions? 0  ?Walking or climbing stairs? 1  ?Dressing or bathing? 0  ?Doing errands, shopping? 0  ? ?Most recent fall risk assessment: ? ?  10/15/2021  ? 10:08 AM  ?Fall Risk   ?Falls in the past year? 1  ?Number falls in past yr: 0  ?Injury  with Fall? 0  ?Risk for fall due to : History of fall(s)  ?Follow up Falls evaluation completed;Education provided;Falls prevention discussed  ? ? Most recent depression screenings: ? ?  10/15/2021  ? 10:07 AM  08/04/2020  ?  9:37 AM  ?PHQ 2/9 Scores  ?PHQ - 2 Score 0 0  ?PHQ- 9 Score 1 2  ? ?Most recent cognitive screening: ?   ? View : No data to display.  ?  ?  ?  ? ?Most recent Audit-C alcohol use screening ? ?  10/15/2021  ? 10:07 AM  ?Alcohol Use Disorder Test (AUDIT)  ?1. How often do you have a drink containing alcohol? 0  ?2. How many drinks containing alcohol do you have on a typical day when you are drinking? 0  ?3. How often do you have six or more drinks on one occasion? 0  ?AUDIT-C Score 0  ? ?A score of 3 or more in women, and 4 or more in men indicates increased risk for alcohol abuse, EXCEPT if all of the points are from question 1  ? ?No results found for any visits on 10/15/21. ? Assessment & Plan  ?  ? ?Annual wellness visit done today including the all of the following: ?Reviewed patient's Family Medical History ?Reviewed and updated list of patient's medical providers ?Assessment of cognitive impairment was done ?Assessed patient's functional ability ?Established a written schedule for health screening services ?Health Risk Assessent Completed and Reviewed ? ?Exercise Activities and Dietary recommendations ? Goals   ?None ?  ? ? ?Immunization History  ?Administered Date(s) Administered  ? Influenza, High Dose Seasonal PF 04/07/2018  ? Influenza-Unspecified 04/15/2015, 04/04/2017, 04/12/2020  ? PFIZER Comirnaty(Gray Top)Covid-19 Tri-Sucrose Vaccine 11/07/2020  ? PFIZER(Purple Top)SARS-COV-2 Vaccination 07/23/2019, 08/13/2019, 03/27/2020  ? Pneumococcal Conjugate-13 05/29/2015  ? Pneumococcal Polysaccharide-23 12/03/2016  ? Zoster Recombinat (Shingrix) 01/08/2018, 03/19/2018  ? ? ?Health Maintenance  ?Topic Date Due  ? MAMMOGRAM  09/04/2001  ? DEXA SCAN  Never done  ? OPHTHALMOLOGY EXAM  12/15/2020  ?  COVID-19 Vaccine (5 - Booster for Pfizer series) 01/02/2021  ? HEMOGLOBIN A1C  09/06/2021  ? TETANUS/TDAP  06/02/2029 (Originally 01/03/1970)  ? COLONOSCOPY (Pts 45-36yr Insurance coverage will need to be confirmed)

## 2021-10-14 ENCOUNTER — Other Ambulatory Visit: Payer: Self-pay | Admitting: Family Medicine

## 2021-10-14 ENCOUNTER — Other Ambulatory Visit: Payer: Self-pay | Admitting: Physician Assistant

## 2021-10-14 DIAGNOSIS — I10 Essential (primary) hypertension: Secondary | ICD-10-CM

## 2021-10-15 ENCOUNTER — Ambulatory Visit (INDEPENDENT_AMBULATORY_CARE_PROVIDER_SITE_OTHER): Payer: Medicare PPO | Admitting: Family Medicine

## 2021-10-15 VITALS — BP 125/80 | HR 91 | Temp 98.5°F | Resp 16 | Ht 63.0 in | Wt 192.0 lb

## 2021-10-15 DIAGNOSIS — E669 Obesity, unspecified: Secondary | ICD-10-CM | POA: Diagnosis not present

## 2021-10-15 DIAGNOSIS — E039 Hypothyroidism, unspecified: Secondary | ICD-10-CM | POA: Diagnosis not present

## 2021-10-15 DIAGNOSIS — E66811 Obesity, class 1: Secondary | ICD-10-CM

## 2021-10-15 DIAGNOSIS — E785 Hyperlipidemia, unspecified: Secondary | ICD-10-CM

## 2021-10-15 DIAGNOSIS — Z1239 Encounter for other screening for malignant neoplasm of breast: Secondary | ICD-10-CM

## 2021-10-15 DIAGNOSIS — Z6834 Body mass index (BMI) 34.0-34.9, adult: Secondary | ICD-10-CM

## 2021-10-15 DIAGNOSIS — E1169 Type 2 diabetes mellitus with other specified complication: Secondary | ICD-10-CM | POA: Diagnosis not present

## 2021-10-15 DIAGNOSIS — I152 Hypertension secondary to endocrine disorders: Secondary | ICD-10-CM

## 2021-10-15 DIAGNOSIS — Z0000 Encounter for general adult medical examination without abnormal findings: Secondary | ICD-10-CM

## 2021-10-15 DIAGNOSIS — Z780 Asymptomatic menopausal state: Secondary | ICD-10-CM

## 2021-10-15 DIAGNOSIS — E1159 Type 2 diabetes mellitus with other circulatory complications: Secondary | ICD-10-CM

## 2021-10-15 MED ORDER — CHLORTHALIDONE 25 MG PO TABS: ORAL_TABLET | ORAL | 1 refills | Status: DC

## 2021-10-15 NOTE — Assessment & Plan Note (Signed)
Previously well controlled Continue Synthroid at current dose  Recheck TSH and adjust Synthroid as indicated   

## 2021-10-15 NOTE — Assessment & Plan Note (Signed)
Previously well controlled Continue statin Repeat FLP and CMP annually Goal LDL < 70  

## 2021-10-15 NOTE — Assessment & Plan Note (Signed)
Discussed importance of healthy weight management Discussed diet and exercise  

## 2021-10-15 NOTE — Telephone Encounter (Signed)
[facility] faxed refill request for the following medications: ? ?chlorthalidone (HYGROTON) 25 MG tablet  ? ?Please advise. ? ?

## 2021-10-15 NOTE — Assessment & Plan Note (Addendum)
Well controlled on home readings Continue current medications Recheck metabolic panel F/u in 6 months  

## 2021-10-15 NOTE — Assessment & Plan Note (Addendum)
Well controlled with last A1c 6.1 ?Assoc with / complicated by HTN, HLD ?Continue current medications ?UTD on vaccines, eye exam (ROI Sent) , foot exam ?On ACEi/ARB ?On Statin  ?Repeat A1c, uACR ?Discussed diet and exercise ?F/u in 6 months  ?

## 2021-10-16 LAB — COMPREHENSIVE METABOLIC PANEL
ALT: 22 IU/L (ref 0–32)
AST: 21 IU/L (ref 0–40)
Albumin/Globulin Ratio: 2 (ref 1.2–2.2)
Albumin: 5 g/dL — ABNORMAL HIGH (ref 3.7–4.7)
Alkaline Phosphatase: 71 IU/L (ref 44–121)
BUN/Creatinine Ratio: 26 (ref 12–28)
BUN: 25 mg/dL (ref 8–27)
Bilirubin Total: 0.6 mg/dL (ref 0.0–1.2)
CO2: 26 mmol/L (ref 20–29)
Calcium: 10.6 mg/dL — ABNORMAL HIGH (ref 8.7–10.3)
Chloride: 97 mmol/L (ref 96–106)
Creatinine, Ser: 0.96 mg/dL (ref 0.57–1.00)
Globulin, Total: 2.5 g/dL (ref 1.5–4.5)
Glucose: 138 mg/dL — ABNORMAL HIGH (ref 70–99)
Potassium: 4.5 mmol/L (ref 3.5–5.2)
Sodium: 142 mmol/L (ref 134–144)
Total Protein: 7.5 g/dL (ref 6.0–8.5)
eGFR: 63 mL/min/{1.73_m2} (ref >59)

## 2021-10-16 LAB — LIPID PANEL WITH LDL/HDL RATIO
Cholesterol, Total: 158 mg/dL (ref 100–199)
HDL: 67 mg/dL (ref >39)
LDL Chol Calc (NIH): 75 mg/dL (ref 0–99)
LDL/HDL Ratio: 1.1 ratio (ref 0.0–3.2)
Triglycerides: 85 mg/dL (ref 0–149)
VLDL Cholesterol Cal: 16 mg/dL (ref 5–40)

## 2021-10-16 LAB — HEMOGLOBIN A1C
Est. average glucose Bld gHb Est-mCnc: 151 mg/dL
Hgb A1c MFr Bld: 6.9 % — ABNORMAL HIGH (ref 4.8–5.6)

## 2021-10-16 LAB — TSH: TSH: 4.01 u[IU]/mL (ref 0.450–4.500)

## 2021-10-16 LAB — MICROALBUMIN / CREATININE URINE RATIO
Creatinine, Urine: 129.1 mg/dL
Microalb/Creat Ratio: 9 mg/g creat (ref 0–29)
Microalbumin, Urine: 11.5 ug/mL

## 2021-11-12 DIAGNOSIS — M1711 Unilateral primary osteoarthritis, right knee: Secondary | ICD-10-CM | POA: Diagnosis not present

## 2021-11-12 DIAGNOSIS — M2392 Unspecified internal derangement of left knee: Secondary | ICD-10-CM | POA: Diagnosis not present

## 2021-11-12 DIAGNOSIS — M25561 Pain in right knee: Secondary | ICD-10-CM | POA: Diagnosis not present

## 2021-11-12 DIAGNOSIS — M1712 Unilateral primary osteoarthritis, left knee: Secondary | ICD-10-CM | POA: Diagnosis not present

## 2021-12-02 ENCOUNTER — Other Ambulatory Visit: Payer: Self-pay | Admitting: Family Medicine

## 2021-12-02 DIAGNOSIS — I10 Essential (primary) hypertension: Secondary | ICD-10-CM

## 2021-12-19 ENCOUNTER — Ambulatory Visit
Admission: RE | Admit: 2021-12-19 | Discharge: 2021-12-19 | Disposition: A | Discharge status: Home / Self Care | Payer: Medicare PPO | Source: Ambulatory Visit | Attending: Family Medicine | Admitting: Family Medicine

## 2021-12-19 DIAGNOSIS — Z7984 Long term (current) use of oral hypoglycemic drugs: Secondary | ICD-10-CM | POA: Diagnosis not present

## 2021-12-19 DIAGNOSIS — Z1239 Encounter for other screening for malignant neoplasm of breast: Secondary | ICD-10-CM | POA: Diagnosis present

## 2021-12-19 DIAGNOSIS — Z8781 Personal history of (healed) traumatic fracture: Secondary | ICD-10-CM | POA: Insufficient documentation

## 2021-12-19 DIAGNOSIS — Z79890 Hormone replacement therapy (postmenopausal): Secondary | ICD-10-CM | POA: Diagnosis not present

## 2021-12-19 DIAGNOSIS — Z780 Asymptomatic menopausal state: Secondary | ICD-10-CM | POA: Diagnosis not present

## 2021-12-19 DIAGNOSIS — E119 Type 2 diabetes mellitus without complications: Secondary | ICD-10-CM | POA: Diagnosis not present

## 2021-12-19 DIAGNOSIS — Z1231 Encounter for screening mammogram for malignant neoplasm of breast: Secondary | ICD-10-CM | POA: Insufficient documentation

## 2021-12-19 DIAGNOSIS — E039 Hypothyroidism, unspecified: Secondary | ICD-10-CM | POA: Insufficient documentation

## 2021-12-26 ENCOUNTER — Other Ambulatory Visit: Payer: Self-pay | Admitting: Orthopedic Surgery

## 2021-12-26 ENCOUNTER — Encounter: Payer: Self-pay | Admitting: Gastroenterology

## 2021-12-26 DIAGNOSIS — M2392 Unspecified internal derangement of left knee: Secondary | ICD-10-CM

## 2021-12-27 ENCOUNTER — Encounter: Payer: Self-pay | Admitting: Anesthesiology

## 2021-12-30 DIAGNOSIS — J069 Acute upper respiratory infection, unspecified: Secondary | ICD-10-CM | POA: Diagnosis not present

## 2022-01-17 ENCOUNTER — Ambulatory Visit
Admission: RE | Admit: 2022-01-17 | Discharge: 2022-01-17 | Disposition: A | Discharge status: Home / Self Care | Payer: Medicare PPO | Source: Ambulatory Visit | Attending: Orthopedic Surgery | Admitting: Orthopedic Surgery

## 2022-01-17 DIAGNOSIS — M2392 Unspecified internal derangement of left knee: Secondary | ICD-10-CM | POA: Diagnosis not present

## 2022-01-17 DIAGNOSIS — M25562 Pain in left knee: Secondary | ICD-10-CM | POA: Diagnosis not present

## 2022-01-20 ENCOUNTER — Other Ambulatory Visit: Payer: Self-pay | Admitting: Family Medicine

## 2022-01-20 DIAGNOSIS — E1169 Type 2 diabetes mellitus with other specified complication: Secondary | ICD-10-CM

## 2022-01-23 ENCOUNTER — Other Ambulatory Visit: Payer: Self-pay | Admitting: Orthopedic Surgery

## 2022-01-28 DIAGNOSIS — M2392 Unspecified internal derangement of left knee: Secondary | ICD-10-CM | POA: Diagnosis not present

## 2022-01-28 DIAGNOSIS — M1710 Unilateral primary osteoarthritis, unspecified knee: Secondary | ICD-10-CM | POA: Diagnosis not present

## 2022-01-29 ENCOUNTER — Inpatient Hospital Stay: Admission: RE | Admit: 2022-01-29 | Payer: Medicare PPO | Source: Ambulatory Visit

## 2022-02-01 ENCOUNTER — Encounter
Admission: RE | Admit: 2022-02-01 | Discharge: 2022-02-01 | Disposition: A | Discharge status: Home / Self Care | Payer: Medicare PPO | Source: Ambulatory Visit | Attending: Orthopedic Surgery | Admitting: Orthopedic Surgery

## 2022-02-01 VITALS — Ht 63.5 in | Wt 187.0 lb

## 2022-02-01 DIAGNOSIS — E1159 Type 2 diabetes mellitus with other circulatory complications: Secondary | ICD-10-CM

## 2022-02-01 DIAGNOSIS — E669 Obesity, unspecified: Secondary | ICD-10-CM

## 2022-02-01 DIAGNOSIS — E1169 Type 2 diabetes mellitus with other specified complication: Secondary | ICD-10-CM

## 2022-02-01 DIAGNOSIS — Z832 Family history of diseases of the blood and blood-forming organs and certain disorders involving the immune mechanism: Secondary | ICD-10-CM

## 2022-02-01 HISTORY — DX: Hypothyroidism, unspecified: E03.9

## 2022-02-01 NOTE — Patient Instructions (Addendum)
Your procedure is scheduled on: Thursday February 07, 2022. Report to Day Surgery inside [facility] 2nd floor, stop by admissions desk before getting on elevator. To find out your arrival time please call [phone] between 1PM - 3PM on Wednesday February 06, 2022.  Remember: Instructions that are not followed completely may result in serious medical risk,  up to and including death, or upon the discretion of your surgeon and anesthesiologist your  surgery may need to be rescheduled.     _X__ 1. Do not eat food after midnight the night before your procedure.                 No chewing gum or hard candies. You may drink clear liquids up to 2 hours                 before you are scheduled to arrive for your surgery- DO not drink clear                 liquids within 2 hours of the start of your surgery.                 Clear Liquids include:  water, G2 or Gatorade Zero (avoid Red/Purple/Blue), Black Coffee or Tea (Do not add                 anything to coffee or tea).  __X__2.   Complete the "Ensure Clear Pre-surgery Clear Carbohydrate Drink" provided to you, 2 hours before arrival. **If you are diabetic you will be provided with an alternative drink, Gatorade Zero or G2.  __X__3.  On the morning of surgery brush your teeth with toothpaste and water, you                may rinse your mouth with mouthwash if you wish.  Do not swallow any toothpaste or mouthwash.     _X__ 4.  No Alcohol for 24 hours before or after surgery.   _X__ 5.  Do Not Smoke or use e-cigarettes For 24 Hours Prior to Your Surgery.                 Do not use any chewable tobacco products for at least 6 hours prior to                 Surgery.  _X__  6.  Do not use any recreational drugs (marijuana, cocaine, heroin, ecstasy, MDMA or other)                For at least one week prior to your surgery.  Combination of these drugs with anesthesia                May have life threatening results.  ____   7.  Bring all medications with you on the day of surgery if instructed.   __X_ 8.  Notify your doctor if there is any change in your medical condition      (cold, fever, infections).     Do not wear jewelry, make-up, hairpins, clips or nail polish. Do not wear lotions, powders, or perfumes. You may wear deodorant. Do not shave 48 hours prior to surgery. Men may shave face and neck. Do not bring valuables to the hospital.    [facility] is not responsible for any belongings or valuables.  Contacts, dentures or bridgework may not be worn into surgery. Leave your suitcase in the car. After surgery it may be brought to your  room. For patients admitted to the hospital, discharge time is determined by your treatment team.   Patients discharged the day of surgery will not be allowed to drive home.   Make arrangements for someone to be with you for the first 24 hours of your Same Day Discharge.   __X__ Take these medicines the morning of surgery with A SIP OF WATER:    1. famotidine (PEPCID) 10 MG tablet  2. levothyroxine (SYNTHROID) 50 MCG tablet  3.   4.  5.  6.  ____ Fleet Enema (as directed)   __X__ Use CHG Soap (or wipes) as directed  ____ Use Benzoyl Peroxide Gel as instructed  ____ Use inhalers on the day of surgery  __X__ Stop metformin 2 days prior to surgery (last dose 02/04/22)    ____ Take 1/2 of usual insulin dose the night before surgery. No insulin the morning          of surgery.   ____ Call your PCP, cardiologist, or Pulmonologist if taking Coumadin/Plavix/aspirin and ask when to stop before your surgery.   __X__ One Week prior to surgery- Stop Anti-inflammatories such as Ibuprofen, Aleve, Advil, Motrin, meloxicam (MOBIC), diclofenac, etodolac, ketorolac, Toradol, Daypro, piroxicam, Goody's or BC powders. OK TO USE TYLENOL IF NEEDED   __X__ Stop supplements until after surgery.    ____ Bring C-Pap to the hospital.    If you have any questions regarding your  pre-procedure instructions,  Please call Pre-admit Testing at [phone]

## 2022-02-04 ENCOUNTER — Inpatient Hospital Stay: Admission: RE | Admit: 2022-02-04 | Payer: Medicare PPO | Source: Ambulatory Visit

## 2022-02-05 ENCOUNTER — Encounter
Admission: RE | Admit: 2022-02-05 | Discharge: 2022-02-05 | Disposition: A | Discharge status: Home / Self Care | Payer: Medicare PPO | Source: Ambulatory Visit | Attending: Orthopedic Surgery | Admitting: Orthopedic Surgery

## 2022-02-05 DIAGNOSIS — Z01810 Encounter for preprocedural cardiovascular examination: Secondary | ICD-10-CM | POA: Diagnosis not present

## 2022-02-05 DIAGNOSIS — I152 Hypertension secondary to endocrine disorders: Secondary | ICD-10-CM | POA: Insufficient documentation

## 2022-02-05 DIAGNOSIS — E1159 Type 2 diabetes mellitus with other circulatory complications: Secondary | ICD-10-CM | POA: Diagnosis not present

## 2022-02-05 DIAGNOSIS — Z01818 Encounter for other preprocedural examination: Secondary | ICD-10-CM | POA: Insufficient documentation

## 2022-02-05 DIAGNOSIS — E785 Hyperlipidemia, unspecified: Secondary | ICD-10-CM | POA: Diagnosis not present

## 2022-02-05 DIAGNOSIS — E1169 Type 2 diabetes mellitus with other specified complication: Secondary | ICD-10-CM | POA: Diagnosis not present

## 2022-02-05 DIAGNOSIS — Z832 Family history of diseases of the blood and blood-forming organs and certain disorders involving the immune mechanism: Secondary | ICD-10-CM

## 2022-02-05 DIAGNOSIS — E669 Obesity, unspecified: Secondary | ICD-10-CM | POA: Diagnosis not present

## 2022-02-05 DIAGNOSIS — Z6834 Body mass index (BMI) 34.0-34.9, adult: Secondary | ICD-10-CM | POA: Insufficient documentation

## 2022-02-05 LAB — CBC
HCT: 37.7 % (ref 36.0–46.0)
Hemoglobin: 11.1 g/dL — ABNORMAL LOW (ref 12.0–15.0)
MCH: 18.3 pg — ABNORMAL LOW (ref 26.0–34.0)
MCHC: 29.4 g/dL — ABNORMAL LOW (ref 30.0–36.0)
MCV: 62.1 fL — ABNORMAL LOW (ref 80.0–100.0)
Platelets: 402 10*3/uL — ABNORMAL HIGH (ref 150–400)
RBC: 6.07 MIL/uL — ABNORMAL HIGH (ref 3.87–5.11)
RDW: 18.5 % — ABNORMAL HIGH (ref 11.5–15.5)
WBC: 7.2 10*3/uL (ref 4.0–10.5)
nRBC: 0 % (ref 0.0–0.2)

## 2022-02-05 LAB — BASIC METABOLIC PANEL
Anion gap: 12 (ref 5–15)
BUN: 26 mg/dL — ABNORMAL HIGH (ref 8–23)
CO2: 27 mmol/L (ref 22–32)
Calcium: 9.9 mg/dL (ref 8.9–10.3)
Chloride: 100 mmol/L (ref 98–111)
Creatinine, Ser: 0.81 mg/dL (ref 0.44–1.00)
GFR, Estimated: >60 mL/min (ref >60)
Glucose, Bld: 127 mg/dL — ABNORMAL HIGH (ref 70–99)
Potassium: 3.4 mmol/L — ABNORMAL LOW (ref 3.5–5.1)
Sodium: 139 mmol/L (ref 135–145)

## 2022-02-07 ENCOUNTER — Encounter
Admission: RE | Disposition: A | Discharge status: Home / Self Care | Payer: Self-pay | Source: Home / Self Care | Attending: Orthopedic Surgery

## 2022-02-07 ENCOUNTER — Encounter: Payer: Self-pay | Admitting: Orthopedic Surgery

## 2022-02-07 ENCOUNTER — Other Ambulatory Visit: Payer: Self-pay

## 2022-02-07 ENCOUNTER — Ambulatory Visit: Payer: Medicare PPO | Admitting: General Practice

## 2022-02-07 ENCOUNTER — Ambulatory Visit
Admission: RE | Admit: 2022-02-07 | Discharge: 2022-02-07 | Disposition: A | Discharge status: Home / Self Care | Payer: Medicare PPO | Attending: Orthopedic Surgery | Admitting: Orthopedic Surgery

## 2022-02-07 ENCOUNTER — Ambulatory Visit: Payer: Medicare PPO

## 2022-02-07 ENCOUNTER — Telehealth: Payer: Self-pay | Admitting: Gastroenterology

## 2022-02-07 ENCOUNTER — Telehealth: Payer: Self-pay

## 2022-02-07 DIAGNOSIS — X58XXXA Exposure to other specified factors, initial encounter: Secondary | ICD-10-CM | POA: Insufficient documentation

## 2022-02-07 DIAGNOSIS — M25561 Pain in right knee: Secondary | ICD-10-CM | POA: Diagnosis not present

## 2022-02-07 DIAGNOSIS — M1712 Unilateral primary osteoarthritis, left knee: Secondary | ICD-10-CM | POA: Diagnosis not present

## 2022-02-07 DIAGNOSIS — E039 Hypothyroidism, unspecified: Secondary | ICD-10-CM | POA: Insufficient documentation

## 2022-02-07 DIAGNOSIS — M23242 Derangement of anterior horn of lateral meniscus due to old tear or injury, left knee: Secondary | ICD-10-CM | POA: Diagnosis not present

## 2022-02-07 DIAGNOSIS — Z87891 Personal history of nicotine dependence: Secondary | ICD-10-CM | POA: Insufficient documentation

## 2022-02-07 DIAGNOSIS — I10 Essential (primary) hypertension: Secondary | ICD-10-CM | POA: Insufficient documentation

## 2022-02-07 DIAGNOSIS — S83512A Sprain of anterior cruciate ligament of left knee, initial encounter: Secondary | ICD-10-CM | POA: Insufficient documentation

## 2022-02-07 DIAGNOSIS — M23304 Other meniscus derangements, unspecified medial meniscus, left knee: Secondary | ICD-10-CM | POA: Diagnosis not present

## 2022-02-07 DIAGNOSIS — E785 Hyperlipidemia, unspecified: Secondary | ICD-10-CM | POA: Insufficient documentation

## 2022-02-07 DIAGNOSIS — Z7984 Long term (current) use of oral hypoglycemic drugs: Secondary | ICD-10-CM | POA: Diagnosis not present

## 2022-02-07 DIAGNOSIS — M23301 Other meniscus derangements, unspecified lateral meniscus, left knee: Secondary | ICD-10-CM | POA: Diagnosis not present

## 2022-02-07 DIAGNOSIS — E1169 Type 2 diabetes mellitus with other specified complication: Secondary | ICD-10-CM

## 2022-02-07 DIAGNOSIS — E119 Type 2 diabetes mellitus without complications: Secondary | ICD-10-CM | POA: Insufficient documentation

## 2022-02-07 DIAGNOSIS — M23332 Other meniscus derangements, other medial meniscus, left knee: Secondary | ICD-10-CM | POA: Diagnosis not present

## 2022-02-07 DIAGNOSIS — M2392 Unspecified internal derangement of left knee: Secondary | ICD-10-CM | POA: Diagnosis not present

## 2022-02-07 HISTORY — PX: KNEE ARTHROSCOPY WITH MENISCAL REPAIR: SHX5653

## 2022-02-07 LAB — GLUCOSE, CAPILLARY
Glucose-Capillary: 147 mg/dL — ABNORMAL HIGH (ref 70–99)
Glucose-Capillary: 177 mg/dL — ABNORMAL HIGH (ref 70–99)

## 2022-02-07 SURGERY — ARTHROSCOPY, KNEE, WITH MENISCUS REPAIR
Anesthesia: General | Site: Knee | Laterality: Left

## 2022-02-07 MED ORDER — MORPHINE SULFATE (PF) 2 MG/ML IV SOLN: 0.5000 mg | INTRAVENOUS | Status: DC | PRN

## 2022-02-07 MED ORDER — OXYCODONE HCL 5 MG PO TABS
5.0000 mg | ORAL_TABLET | Freq: Once | ORAL | Status: AC | PRN
  Administered 2022-02-07: 5 mg via ORAL

## 2022-02-07 MED ORDER — MIDAZOLAM HCL 2 MG/2ML IJ SOLN
INTRAMUSCULAR | Status: DC | PRN
  Administered 2022-02-07: 2 mg via INTRAVENOUS

## 2022-02-07 MED ORDER — ONDANSETRON HCL 4 MG/2ML IJ SOLN: 4.0000 mg | Freq: Four times a day (QID) | INTRAMUSCULAR | Status: DC | PRN

## 2022-02-07 MED ORDER — ONDANSETRON HCL 4 MG PO TABS: 4.0000 mg | ORAL_TABLET | Freq: Four times a day (QID) | ORAL | Status: DC | PRN

## 2022-02-07 MED ORDER — SODIUM CHLORIDE 0.9 % IV SOLN: INTRAVENOUS | Status: DC

## 2022-02-07 MED ORDER — CHLORHEXIDINE GLUCONATE 0.12 % MT SOLN
OROMUCOSAL | Status: AC
  Administered 2022-02-07: 15 mL via OROMUCOSAL
  Filled 2022-02-07: qty 15

## 2022-02-07 MED ORDER — PROPOFOL 10 MG/ML IV BOLUS
INTRAVENOUS | Status: DC | PRN
  Administered 2022-02-07: 140 mg via INTRAVENOUS

## 2022-02-07 MED ORDER — HYDROCODONE-ACETAMINOPHEN 5-325 MG PO TABS: 1.0000 | ORAL_TABLET | ORAL | 0 refills | Status: AC | PRN

## 2022-02-07 MED ORDER — FENTANYL CITRATE (PF) 100 MCG/2ML IJ SOLN
INTRAMUSCULAR | Status: AC
  Filled 2022-02-07: qty 2

## 2022-02-07 MED ORDER — ACETAMINOPHEN 10 MG/ML IV SOLN
INTRAVENOUS | Status: DC | PRN
  Administered 2022-02-07: 1000 mg via INTRAVENOUS

## 2022-02-07 MED ORDER — OXYCODONE HCL 5 MG/5ML PO SOLN: 5.0000 mg | Freq: Once | ORAL | Status: AC | PRN

## 2022-02-07 MED ORDER — CEFAZOLIN SODIUM-DEXTROSE 2-4 GM/100ML-% IV SOLN
2.0000 g | INTRAVENOUS | Status: AC
  Administered 2022-02-07: 2 g via INTRAVENOUS

## 2022-02-07 MED ORDER — KETOROLAC TROMETHAMINE 30 MG/ML IJ SOLN
INTRAMUSCULAR | Status: AC
  Filled 2022-02-07: qty 1

## 2022-02-07 MED ORDER — HYDROCODONE-ACETAMINOPHEN 7.5-325 MG PO TABS: 1.0000 | ORAL_TABLET | ORAL | Status: DC | PRN

## 2022-02-07 MED ORDER — CHLORHEXIDINE GLUCONATE 0.12 % MT SOLN: 15.0000 mL | Freq: Once | OROMUCOSAL | Status: AC

## 2022-02-07 MED ORDER — RINGERS IRRIGATION IR SOLN
Status: DC | PRN
  Administered 2022-02-07: 3000 mL

## 2022-02-07 MED ORDER — ACETAMINOPHEN 10 MG/ML IV SOLN
INTRAVENOUS | Status: AC
  Filled 2022-02-07: qty 100

## 2022-02-07 MED ORDER — HYDROCODONE-ACETAMINOPHEN 5-325 MG PO TABS: 1.0000 | ORAL_TABLET | ORAL | Status: DC | PRN

## 2022-02-07 MED ORDER — MIDAZOLAM HCL 2 MG/2ML IJ SOLN
INTRAMUSCULAR | Status: AC
  Filled 2022-02-07: qty 2

## 2022-02-07 MED ORDER — PROPOFOL 10 MG/ML IV BOLUS
INTRAVENOUS | Status: AC
  Filled 2022-02-07: qty 20

## 2022-02-07 MED ORDER — ORAL CARE MOUTH RINSE: 15.0000 mL | Freq: Once | OROMUCOSAL | Status: AC

## 2022-02-07 MED ORDER — ACETAMINOPHEN 325 MG PO TABS: 325.0000 mg | ORAL_TABLET | Freq: Four times a day (QID) | ORAL | Status: DC | PRN

## 2022-02-07 MED ORDER — OXYCODONE HCL 5 MG PO TABS
ORAL_TABLET | ORAL | Status: AC
  Filled 2022-02-07: qty 1

## 2022-02-07 MED ORDER — KETOROLAC TROMETHAMINE 30 MG/ML IJ SOLN
INTRAMUSCULAR | Status: DC | PRN
  Administered 2022-02-07: 15 mg via INTRAVENOUS

## 2022-02-07 MED ORDER — DEXAMETHASONE SODIUM PHOSPHATE 10 MG/ML IJ SOLN
INTRAMUSCULAR | Status: DC | PRN
  Administered 2022-02-07: 10 mg via INTRAVENOUS

## 2022-02-07 MED ORDER — BUPIVACAINE-EPINEPHRINE (PF) 0.5% -1:200000 IJ SOLN
INTRAMUSCULAR | Status: DC | PRN
  Administered 2022-02-07: 20 mL via PERINEURAL

## 2022-02-07 MED ORDER — METOCLOPRAMIDE HCL 5 MG/ML IJ SOLN: 5.0000 mg | Freq: Three times a day (TID) | INTRAMUSCULAR | Status: DC | PRN

## 2022-02-07 MED ORDER — METOCLOPRAMIDE HCL 10 MG PO TABS: 5.0000 mg | ORAL_TABLET | Freq: Three times a day (TID) | ORAL | Status: DC | PRN

## 2022-02-07 MED ORDER — FENTANYL CITRATE (PF) 100 MCG/2ML IJ SOLN: 25.0000 ug | INTRAMUSCULAR | Status: DC | PRN

## 2022-02-07 MED ORDER — FENTANYL CITRATE (PF) 100 MCG/2ML IJ SOLN
INTRAMUSCULAR | Status: DC | PRN
  Administered 2022-02-07 (×4): 25 ug via INTRAVENOUS

## 2022-02-07 MED ORDER — BUPIVACAINE-EPINEPHRINE (PF) 0.5% -1:200000 IJ SOLN
INTRAMUSCULAR | Status: AC
  Filled 2022-02-07: qty 30

## 2022-02-07 MED ORDER — ONDANSETRON HCL 4 MG/2ML IJ SOLN
INTRAMUSCULAR | Status: DC | PRN
  Administered 2022-02-07: 4 mg via INTRAVENOUS

## 2022-02-07 MED ORDER — EPINEPHRINE PF 1 MG/ML IJ SOLN
INTRAMUSCULAR | Status: AC
  Filled 2022-02-07: qty 1

## 2022-02-07 MED ORDER — LIDOCAINE HCL (CARDIAC) PF 100 MG/5ML IV SOSY
PREFILLED_SYRINGE | INTRAVENOUS | Status: DC | PRN
  Administered 2022-02-07: 100 mg via INTRAVENOUS

## 2022-02-07 MED ORDER — CEFAZOLIN SODIUM-DEXTROSE 2-4 GM/100ML-% IV SOLN
INTRAVENOUS | Status: AC
  Filled 2022-02-07: qty 100

## 2022-02-07 SURGICAL SUPPLY — 26 items
BNDG ELASTIC 4X5.8 VLCR STR LF (GAUZE/BANDAGES/DRESSINGS) ×1 IMPLANT
CHLORAPREP W/TINT 26 (MISCELLANEOUS) ×2 IMPLANT
DRAPE ARTHRO LIMB 89X125 STRL (DRAPES) ×2 IMPLANT
GAUZE SPONGE 4X4 12PLY STRL (GAUZE/BANDAGES/DRESSINGS) ×2 IMPLANT
GLOVE SURG SYN 9.0  PF PI (GLOVE) ×1
GLOVE SURG SYN 9.0 PF PI (GLOVE) ×1 IMPLANT
GOWN SRG 2XL LVL 4 RGLN SLV (GOWNS) ×1 IMPLANT
GOWN STRL NON-REIN 2XL LVL4 (GOWNS) ×1
GOWN STRL REUS W/ TWL LRG LVL3 (GOWN DISPOSABLE) ×2 IMPLANT
GOWN STRL REUS W/TWL LRG LVL3 (GOWN DISPOSABLE) ×2
IV LACTATED RINGER IRRG 3000ML (IV SOLUTION) ×2
IV LR IRRIG 3000ML ARTHROMATIC (IV SOLUTION) ×2 IMPLANT
KIT TURNOVER KIT A (KITS) ×2 IMPLANT
MANIFOLD NEPTUNE II (INSTRUMENTS) ×3 IMPLANT
NEEDLE HYPO 22GX1.5 SAFETY (NEEDLE) ×2 IMPLANT
PACK ARTHROSCOPY KNEE (MISCELLANEOUS) ×2 IMPLANT
SCALPEL PROTECTED #11 DISP (BLADE) ×2 IMPLANT
SPONGE T-LAP 18X18 ~~LOC~~+RFID (SPONGE) ×2 IMPLANT
SUT ETHILON 4-0 (SUTURE) ×1
SUT ETHILON 4-0 FS2 18XMFL BLK (SUTURE) ×1
SUTURE ETHLN 4-0 FS2 18XMF BLK (SUTURE) ×1 IMPLANT
TRAP FLUID SMOKE EVACUATOR (MISCELLANEOUS) ×2 IMPLANT
TUBING INFLOW SET DBFLO PUMP (TUBING) ×2 IMPLANT
TUBING OUTFLOW SET DBLFO PUMP (TUBING) ×2 IMPLANT
WAND COBLATION FLOW 50 (SURGICAL WAND) ×1 IMPLANT
WATER STERILE IRR 500ML POUR (IV SOLUTION) ×2 IMPLANT

## 2022-02-07 NOTE — Telephone Encounter (Signed)
Patient called stating she needs to reschedule her colonoscopy due to the fact she just had knee surgery. Requests a call back to get rescheduled.

## 2022-02-07 NOTE — Discharge Instructions (Addendum)
Keep dressing clean and dry 81 mg aspirin twice a day until walking normally Pain medicine as directed Try to limit activity through the weekend Call office if you are having problems  AMBULATORY SURGERY  DISCHARGE INSTRUCTIONS   The drugs that you were given will stay in your system until tomorrow so for the next 24 hours you should not:  Drive an automobile Make any legal decisions Drink any alcoholic beverage   You may resume regular meals tomorrow.  Today it is better to start with liquids and gradually work up to solid foods.  You may eat anything you prefer, but it is better to start with liquids, then soup and crackers, and gradually work up to solid foods.   Please notify your doctor immediately if you have any unusual bleeding, trouble breathing, redness and pain at the surgery site, drainage, fever, or pain not relieved by medication.    Additional Instructions:     Please contact your physician with any problems or Same Day Surgery at [phone], Monday through Friday 6 am to 4 pm, or [place] at [facility] number at [phone].

## 2022-02-07 NOTE — Anesthesia Postprocedure Evaluation (Signed)
Anesthesia Post Note  Patient: Joyce Morton  Procedure(s) Performed: Left knee lateral meniscus tear repair and small medial meniscus tear repair (Left: Knee)  Patient location during evaluation: PACU Anesthesia Type: General Level of consciousness: awake and alert Pain management: pain level controlled Vital Signs Assessment: post-procedure vital signs reviewed and stable Respiratory status: spontaneous breathing, nonlabored ventilation, respiratory function stable and patient connected to nasal cannula oxygen Cardiovascular status: blood pressure returned to baseline and stable Postop Assessment: no apparent nausea or vomiting Anesthetic complications: no   No notable events documented.   Last Vitals:  Vitals:   02/07/22 1053 02/07/22 1100  BP:  132/72  Pulse: 96 90  Resp: (!) 23 13  Temp:    SpO2: 95% 100%    Last Pain:  Vitals:   02/07/22 1114  TempSrc:   PainSc: [place]

## 2022-02-07 NOTE — Anesthesia Preprocedure Evaluation (Signed)
Anesthesia Evaluation  Patient identified by MRN, date of birth, ID band Patient awake    Reviewed: Allergy & Precautions, NPO status , Patient's Chart, lab work & pertinent test results  History of Anesthesia Complications Negative for: history of anesthetic complications  Airway Mallampati: IV  TM Distance: >3 FB Neck ROM: full    Dental  (+) Poor Dentition   Pulmonary neg pulmonary ROS, neg shortness of breath, neg COPD, neg recent URI, former smoker,    Pulmonary exam normal        Cardiovascular hypertension, (-) angina(-) CAD, (-) Past MI and (-) CABG negative cardio ROS Normal cardiovascular exam     Neuro/Psych PSYCHIATRIC DISORDERS Anxiety negative neurological ROS     GI/Hepatic negative GI ROS, Neg liver ROS,   Endo/Other  diabetesHypothyroidism   Renal/GU      Musculoskeletal   Abdominal   Peds  Hematology negative hematology ROS (+)   Anesthesia Other Findings Past Medical History: No date: Anxiety No date: Diabetes mellitus without complication (HCC) No date: GERD (gastroesophageal reflux disease) No date: Hyperlipidemia No date: Hypertension No date: Hypothyroidism No date: Vertigo     Comment:  only a few brief episodes at night  Past Surgical History: No date: APPENDECTOMY     Comment:  as a child 07/2021: CATARACT EXTRACTION, BILATERAL; Bilateral 01/16/2017: COLONOSCOPY WITH PROPOFOL; N/A     Comment:  Procedure: COLONOSCOPY WITH PROPOFOL;  Surgeon: Lucilla Lame, MD;  Location: [facility];  Service:               Endoscopy;  Laterality: N/A;  Diabetic - oral meds 01/16/2017: POLYPECTOMY; N/A     Comment:  Procedure: POLYPECTOMY;  Surgeon: Lucilla Lame, MD;                Location: [place];  Service: Endoscopy;                Laterality: N/A;  BMI    Body Mass Index: 32.61 kg/m      Reproductive/Obstetrics negative OB ROS                              Anesthesia Physical Anesthesia Plan  ASA: 2  Anesthesia Plan: General LMA   Post-op Pain Management:    Induction: Intravenous  PONV Risk Score and Plan: Dexamethasone, Ondansetron, Midazolam and Treatment may vary due to age or medical condition  Airway Management Planned: LMA  Additional Equipment:   Intra-op Plan:   Post-operative Plan: Extubation in OR  Informed Consent: I have reviewed the patients History and Physical, chart, labs and discussed the procedure including the risks, benefits and alternatives for the proposed anesthesia with the patient or authorized representative who has indicated his/her understanding and acceptance.     Dental Advisory Given  Plan Discussed with: Anesthesiologist, CRNA and Surgeon  Anesthesia Plan Comments: (Patient consented for risks of anesthesia including but not limited to:  - adverse reactions to medications - damage to eyes, teeth, lips or other oral mucosa - nerve damage due to positioning  - sore throat or hoarseness - Damage to heart, brain, nerves, lungs, other parts of body or loss of life  Patient voiced understanding.)        Anesthesia Quick Evaluation

## 2022-02-07 NOTE — Anesthesia Procedure Notes (Signed)
Procedure Name: LMA Insertion Date/Time: 02/07/2022 10:02 AM  Performed by: Debe Coder, CRNAPre-anesthesia Checklist: Patient identified, Emergency Drugs available, Suction available and Patient being monitored Patient Re-evaluated:Patient Re-evaluated prior to induction Oxygen Delivery Method: Circle system utilized Preoxygenation: Pre-oxygenation with 100% oxygen Induction Type: IV induction Ventilation: Mask ventilation without difficulty LMA: LMA inserted LMA Size: 4.0 Number of attempts: 1 Placement Confirmation: positive ETCO2 and breath sounds checked- equal and bilateral Tube secured with: Tape Dental Injury: Teeth and Oropharynx as per pre-operative assessment

## 2022-02-07 NOTE — Telephone Encounter (Signed)
Patient has requested to cancel her colonoscopy with Dr. Allen Norris due to recent knee surgery.  She will call back to reschedule her colonoscopy once she has fully recovered.  Thanks, [place], [place]

## 2022-02-07 NOTE — Transfer of Care (Signed)
Immediate Anesthesia Transfer of Care Note  Patient: Joyce Morton  Procedure(s) Performed: Left knee lateral meniscus tear repair and small medial meniscus tear repair (Left: Knee)  Patient Location: PACU  Anesthesia Type:General  Level of Consciousness: awake, oriented and drowsy  Airway & Oxygen Therapy: Patient Spontanous Breathing  Post-op Assessment: Report given to RN and Post -op Vital signs reviewed and stable  Post vital signs: Reviewed and stable  Last Vitals:  Vitals Value Taken Time  BP 147/86 02/07/22 1045  Temp 36.1 C 02/07/22 1045  Pulse 90 02/07/22 1053  Resp 18 02/07/22 1053  SpO2 94 % 02/07/22 1053  Vitals shown include unvalidated device data.  Last Pain:  Vitals:   02/07/22 1045  TempSrc:   PainSc: Asleep         Complications: No notable events documented.

## 2022-02-07 NOTE — H&P (Signed)
Chief Complaint  Patient presents with   Left Knee - Follow-up    History of the Present Illness: Joyce Morton is a 71 y.o. female here today.  The patient presents for evaluation of left knee medial and lateral meniscus tears. She is scheduled for a left knee arthroscopy. MRI was on 01/17/2022 at [facility] that showed medial and lateral meniscus tears with partial thickness cartilage loss, some partial ACL tear. She comes back today for H and P.  The patient has no history of blood clots.  I have reviewed past medical, surgical, social and family history, and allergies as documented in the EMR.  Past Medical History: Past Medical History:  Diagnosis Date   Diabetes mellitus without complication (CMS-HCC)   GERD (gastroesophageal reflux disease)   Hyperlipidemia   Hypertension   Osteoarthritis   Past Surgical History: Past Surgical History:  Procedure Laterality Date   EXTRACTION CATARACT EXTRACAPSULAR W/INSERTION INTRAOCULAR PROSTHESIS Right 07/04/2021  Procedure: RIGHT: LenSx/Toric/Dextenza -EXTRACAPSULAR CATARACT PHACO REMOVAL WITH INSERTION OF INTRAOCULAR LENS PROSTHESIS (1 STAGE PROCEDURE), MANUAL OR MECHANICAL TECHNIQUE; WITHOUT ENDOSCOPIC CYCLOPHOTOCOAGULATION; Surgeon: Ross Ludwig, MD; Location: [facility]; Service: Ophthalmology; Laterality: Right;   INSJ RX ELUTING IMPLT PUNCTAL DILAT LAC CANAL EA Right 07/04/2021  Procedure: Insertion of drug-eluting implant, including punctal dilation when performed, into lacrimal canaliculus, each; Surgeon: Ross Ludwig, MD; Location: [facility]; Service: Ophthalmology; Laterality: Right;   EXTRACTION CATARACT EXTRACAPSULAR W/INSERTION INTRAOCULAR PROSTHESIS Left 07/18/2021  Procedure: LEFT: LenSx/Toric/Dextenza -EXTRACAPSULAR CATARACT PHACO REMOVAL WITH INSERTION OF INTRAOCULAR LENS PROSTHESIS (1 STAGE PROCEDURE), MANUAL OR MECHANICAL TECHNIQUE; WITHOUT ENDOSCOPIC CYCLOPHOTOCOAGULATION; Surgeon: Ross Ludwig, MD; Location: [facility]; Service: Ophthalmology; Laterality: Left;   INSJ RX ELUTING IMPLT PUNCTAL DILAT LAC CANAL EA Left 07/18/2021  Procedure: Insertion of drug-eluting implant, including punctal dilation when performed, into lacrimal canaliculus, each; Surgeon: Ross Ludwig, MD; Location: [facility]; Service: Ophthalmology; Laterality: Left;   APPENDECTOMY  childhood   COLONOSCOPY   Past Family History: Family History  Problem Relation Age of Onset   Macular degeneration Father   Cancer Father   Glaucoma Neg Hx   Diabetes type I Neg Hx   Anesthesia problems Neg Hx   Medications: Current Outpatient Medications Ordered in Epic  Medication Sig Dispense Refill   atorvastatin (LIPITOR) 40 MG tablet Take 1 tablet by mouth at bedtime   chlorthalidone 25 MG tablet Take 1 tablet by mouth once daily   famotidine (PEPCID) 10 MG tablet Take 1 tablet by mouth once daily   levothyroxine (SYNTHROID) 50 MCG tablet Take 1 tablet by mouth once daily   losartan (COZAAR) 50 MG tablet Take 1 tablet by mouth once daily   metFORMIN (GLUCOPHAGE) 1000 MG tablet Take 1 tablet by mouth 2 (two) times daily With food   multivitamin tablet Take 1 tablet by mouth once daily   ranitidine (ZANTAC) 150 MG capsule Take 1 capsule by mouth 2 (two) times daily   No current Epic-ordered facility-administered medications on file.   Allergies: Allergies  Allergen Reactions   Bupropion Hives   Lisinopril Other (See Comments)  Patient unsure what happens when she takes it    Body mass index is 32.17 kg/m.  Review of Systems: A comprehensive 14 point ROS was performed, reviewed, and the pertinent orthopaedic findings are documented in the HPI.  Vitals:  01/28/22 0917  BP: 138/76    General Physical Examination:   General/Constitutional: No apparent distress: well-nourished and well developed. Eyes: Pupils equal, round with synchronous movement. Lungs:  Clear to auscultation HEENT:  Normal Vascular: No edema, swelling or tenderness, except as noted in detailed exam. Cardiac: Heart rate and rhythm is regular. Integumentary: No impressive skin lesions present, except as noted in detailed exam. Neuro/Psych: Normal mood and affect, oriented to person, place and time.  On exam, left knee patellofemoral crepitation. Left knee range of motion of 0-115 degrees. Medial and lateral joint line tenderness.  Radiographs:  No new imaging studies were obtained today.  Assessment: ICD-10-CM  1. Internal derangement of left knee M23.92  2. Arthritis of knee M17.10   Plan:  The patient has clinical findings of left knee medial and lateral meniscus tears with partial-thickness cartilage loss and some partial ACL tear.  We discussed the patient's prior MRI findings. I explained she has medial and lateral meniscus tears with partial-thickness cartilage loss and mild arthritis. I recommend left knee arthroscopy, partial medial and lateral meniscectomy, addressing other problems that we might find. I explained the surgery and postoperative course in detail. The patient viewed a video on knee arthroscopy. I advised her to take an aspirin a day after surgery until she is walking normal for the first 2 to 3 days after surgery. I gave the patient a printout on knee exercises.  We will schedule the patient for left knee arthroscopy in the near future.  Surgical Risks:  The nature of the condition and the proposed procedure has been reviewed in detail with the patient. Surgical versus non-surgical options and prognosis for recovery have been reviewed and the inherent risks and benefits of each have been discussed including the risks of infection, bleeding, injury to nerves/blood vessels/tendons, incomplete relief of symptoms, persisting pain and/or stiffness, loss of function, complex regional pain syndrome, failure of the procedure, as appropriate.  Teeth: Normal.  Document Attestation: I,  Maylon Cos, have reviewed and updated documentation for [facility], MD, utilizing Nuance DAX.    Electronically signed by Lauris Poag, MD at 01/29/2022 4:29 PM EDT  Reviewed  H+P. No changes noted.

## 2022-02-07 NOTE — Op Note (Signed)
02/07/2022  10:52 AM  PATIENT:  Joyce Morton  71 y.o. female  PRE-OPERATIVE DIAGNOSIS:  Arthritis of knee M17.10 Acute pain of right knee M25.561 Internal derangement of left knee  M23.92  POST-OPERATIVE DIAGNOSIS:  Arthritis of knee M17.10 Acute pain of right knee M25.561 Internal derangement of left knee  M23.92  PROCEDURE:  Procedure(s): Left knee lateral meniscus tear repair and small medial meniscus tear repair (Left)  SURGEON: Laurene Footman, MD  ASSISTANTS: None  ANESTHESIA:   general  EBL:  Total I/O In: 100 [IV Piggyback:100] Out: -   BLOOD ADMINISTERED:none  DRAINS: none   LOCAL MEDICATIONS USED:  MARCAINE     SPECIMEN:  No Specimen  DISPOSITION OF SPECIMEN:  N/A  COUNTS:  YES  TOURNIQUET:  * Missing tourniquet times found for documented tourniquets in log: [id number] *  IMPLANTS: None  DICTATION: .Dragon Dictation patient was brought to the operating room and after adequate general anesthesia was obtained a tourniquet was applied the upper thigh with arthroscopic leg holder.  The leg was prepped and draped in the usual sterile fashion and appropriate patient identification and timeout procedures were completed.  An inferior lateral portal was made and the arthroscope introduced.  Initial inspection revealed mild patellofemoral degenerative change with normal tracking there were no loose bodies in the gutters medial compartment had significant articular cartilage loss to the medial lateral third of the medial femoral condyle in the weightbearing area there was a small tear of the posterior third with chondrocalcinosis evident going to the notch the ACL was intact the lateral compartment had a very large anterior horn tear with piece of the meniscus flipped up into the joint.  Pictures of this were obtained prior to using the ArthroCare wand to ablate the meniscal tears with post procedure pictures obtained after getting back to stable margins and identifying  that the posterior horn the lateral meniscus was intact the knee was irrigated till clear and all instrumentation was withdrawn.  20 cc of half percent Sensorcaine were infiltrated for postop analgesia wounds were closed with simple interrupted 4-0 nylon followed by Xeroform 4 x 4 web roll and Ace wrap  PLAN OF CARE: Discharge to home after PACU  PATIENT DISPOSITION:  PACU - hemodynamically stable.

## 2022-02-24 ENCOUNTER — Other Ambulatory Visit: Payer: Self-pay | Admitting: Family Medicine

## 2022-02-24 DIAGNOSIS — I10 Essential (primary) hypertension: Secondary | ICD-10-CM

## 2022-02-25 ENCOUNTER — Ambulatory Visit: Payer: Self-pay

## 2022-02-25 NOTE — Patient Outreach (Signed)
  Care Coordination   02/25/2022 Name: Joyce Morton MRN: [id number] DOB: 06/07/1951   Care Coordination Outreach Attempts:  An unsuccessful telephone outreach was attempted today to offer the patient information about available care coordination services as a benefit of their health plan.   Follow Up Plan:  Additional outreach attempts will be made to offer the patient care coordination information and services.   Encounter Outcome:  No Answer  Care Coordination Interventions Activated:  No   Care Coordination Interventions:  No, not indicated    Noreene Larsson RN, MSN, CCM Community Care Coordinator [facility] Mobile: [phone]

## 2022-03-07 ENCOUNTER — Ambulatory Visit: Admission: RE | Admit: 2022-03-07 | Payer: Medicare PPO | Source: Home / Self Care | Admitting: Gastroenterology

## 2022-03-07 SURGERY — COLONOSCOPY WITH PROPOFOL
Anesthesia: Choice

## 2022-04-14 ENCOUNTER — Other Ambulatory Visit: Payer: Self-pay | Admitting: Family Medicine

## 2022-04-14 DIAGNOSIS — I10 Essential (primary) hypertension: Secondary | ICD-10-CM

## 2022-04-18 ENCOUNTER — Ambulatory Visit: Payer: Medicare PPO | Admitting: Family Medicine

## 2022-04-29 NOTE — Progress Notes (Unsigned)
I,Paxson Harrower S Maty Zeisler,acting as a Education administrator for Lavon Paganini, MD.,have documented all relevant documentation on the behalf of Lavon Paganini, MD,as directed by  Lavon Paganini, MD while in the presence of Lavon Paganini, MD.     Established patient visit   Patient: Joyce Morton   DOB: 02/14/1951   71 y.o. Female  MRN: [id number] Visit Date: 04/30/2022  Today's healthcare provider: Lavon Paganini, MD   Chief Complaint  Patient presents with   Diabetes   Subjective    HPI  Diabetes Mellitus Type II, follow-up  Lab Results  Component Value Date   HGBA1C 6.7 (A) 04/30/2022   HGBA1C 6.9 (H) 10/15/2021   HGBA1C 6.1 (A) 03/09/2021   Last seen for diabetes 6 months ago.  Management since then includes continuing the same treatment. She reports excellent compliance with treatment. She is not having side effects.   Home blood sugar records: fasting range: 120-150s  Episodes of hypoglycemia? No    Current insulin regiment: none Most Recent Eye Exam: UTD Request sent to [facility]  --------------------------------------------------------------------------------------------------- Hypertension, follow-up  BP Readings from Last 3 Encounters:  04/30/22 134/82  02/07/22 (!) 154/80  10/15/21 125/80   Wt Readings from Last 3 Encounters:  04/30/22 189 lb (85.7 kg)  02/07/22 187 lb (84.8 kg)  02/01/22 187 lb (84.8 kg)     She was last seen for hypertension 6 months ago.  BP at that visit was 125/80. Management since that visit includes no changes. She reports excellent compliance with treatment. She is not having side effects.  She is exercising. She is not adherent to low salt diet.   Outside blood pressures are elevated.  She does not smoke.  Use of agents associated with hypertension: none.   --------------------------------------------------------------------------------------------------- Lipid/Cholesterol, follow-up  Last Lipid Panel: Lab  Results  Component Value Date   CHOL 158 10/15/2021   LDLCALC 75 10/15/2021   HDL 67 10/15/2021   TRIG 85 10/15/2021    She was last seen for this 6 months ago.  Management since that visit includes no changes.  She reports excellent compliance with treatment. She is not having side effects.   Symptoms: No appetite changes No foot ulcerations  No chest pain No chest pressure/discomfort  No dyspnea No orthopnea  No fatigue Yes lower extremity edema  No palpitations No paroxysmal nocturnal dyspnea  No nausea No numbness or tingling of extremity  No polydipsia No polyuria  No speech difficulty No syncope   She is following a Regular diet. Current exercise: walking  Last metabolic panel Lab Results  Component Value Date   GLUCOSE 127 (H) 02/05/2022   NA 139 02/05/2022   K 3.4 (L) 02/05/2022   BUN 26 (H) 02/05/2022   CREATININE 0.81 02/05/2022   EGFR 63 10/15/2021   GFRNONAA >60 02/05/2022   CALCIUM 9.9 02/05/2022   AST 21 10/15/2021   ALT 22 10/15/2021   The 10-year ASCVD risk score (Arnett DK, et al., 2019) is: 28.3%  ---------------------------------------------------------------------------------------------------   Medications: Outpatient Medications Prior to Visit  Medication Sig   Accu-Chek Softclix Lancets lancets Use as instructed   acetaminophen (TYLENOL) 500 MG tablet Take 1,000 mg by mouth every 6 (six) hours as needed.   atorvastatin (LIPITOR) 40 MG tablet TAKE 1 TABLET(40 MG) BY MOUTH DAILY AT 6 PM   blood glucose meter kit and supplies KIT Dispense based on patient and insurance preference. Use 2-4 x weekly.   chlorthalidone (HYGROTON) 25 MG tablet TAKE 1 TABLET(25 MG) BY  MOUTH DAILY   famotidine (PEPCID) 10 MG tablet Take 10 mg by mouth daily.   glucose blood (ACCU-CHEK AVIVA PLUS) test strip To check blood sugar daily   HYDROcodone-acetaminophen (NORCO/VICODIN) 5-325 MG tablet Take 1 tablet by mouth every 4 (four) hours as needed for moderate pain.    levothyroxine (SYNTHROID) 50 MCG tablet TAKE 1 TABLET(50 MCG) BY MOUTH DAILY   losartan (COZAAR) 100 MG tablet TAKE 1 TABLET(100 MG) BY MOUTH DAILY   metFORMIN (GLUCOPHAGE) 1000 MG tablet TAKE 1 TABLET(1000 MG) BY MOUTH TWICE DAILY WITH A MEAL   Multiple Vitamin (MULTIVITAMIN) tablet Take 1 tablet by mouth daily.   [DISCONTINUED] nystatin cream (MYCOSTATIN) Apply 1 application topically 2 (two) times daily. (Patient not taking: Reported on 04/30/2022)   No facility-administered medications prior to visit.    Review of Systems  Constitutional:  Negative for appetite change and fatigue.  Eyes:  Negative for visual disturbance.  Respiratory:  Negative for chest tightness and shortness of breath.   Cardiovascular:  Positive for leg swelling. Negative for chest pain and palpitations.  Gastrointestinal:  Negative for abdominal pain, diarrhea, nausea and vomiting.  Neurological:  Positive for facial asymmetry.       Objective    BP 134/82 (BP Location: Left Arm, Patient Position: Sitting, Cuff Size: Large)   Pulse 93   Temp 98.9 F (37.2 C) (Oral)   Resp 16   Wt 189 lb (85.7 kg)   BMI 32.95 kg/m  BP Readings from Last 3 Encounters:  04/30/22 134/82  02/07/22 (!) 154/80  10/15/21 125/80   Wt Readings from Last 3 Encounters:  04/30/22 189 lb (85.7 kg)  02/07/22 187 lb (84.8 kg)  02/01/22 187 lb (84.8 kg)      Physical Exam Vitals reviewed.  Constitutional:      General: She is not in acute distress.    Appearance: Normal appearance. She is well-developed. She is not diaphoretic.  HENT:     Head: Normocephalic and atraumatic.  Eyes:     General: No scleral icterus.    Conjunctiva/sclera: Conjunctivae normal.  Neck:     Thyroid: No thyromegaly.  Cardiovascular:     Rate and Rhythm: Normal rate and regular rhythm.     Pulses: Normal pulses.     Heart sounds: Normal heart sounds. No murmur heard. Pulmonary:     Effort: Pulmonary effort is normal. No respiratory  distress.     Breath sounds: Normal breath sounds. No wheezing, rhonchi or rales.  Musculoskeletal:     Cervical back: Neck supple.     Right lower leg: No edema.     Left lower leg: No edema.  Lymphadenopathy:     Cervical: No cervical adenopathy.  Skin:    General: Skin is warm and dry.     Findings: No rash.  Neurological:     Mental Status: She is alert and oriented to person, place, and time. Mental status is at baseline.  Psychiatric:        Mood and Affect: Mood normal.        Behavior: Behavior normal.     Diabetic Foot Exam - Simple   Simple Foot Form Diabetic Foot exam was performed with the following findings: Yes 04/30/2022  8:48 AM  Visual Inspection No deformities, no ulcerations, no other skin breakdown bilaterally: Yes Sensation Testing Intact to touch and monofilament testing bilaterally: Yes Pulse Check Posterior Tibialis and Dorsalis pulse intact bilaterally: Yes Comments      Results for orders  placed or performed in visit on 04/30/22  POCT glycosylated hemoglobin (Hb A1C)  Result Value Ref Range   Hemoglobin A1C 6.7 (A) 4.0 - 5.6 %   Est. average glucose Bld gHb Est-mCnc 146     Assessment & Plan     Problem List Items Addressed This Visit       Cardiovascular and Mediastinum   Hypertension associated with diabetes ([place])    Well controlled Continue current medications Reviewed recent metabolic panel        Endocrine   Hyperlipidemia associated with type 2 diabetes mellitus ([place])    Previously well controlled Continue statin Repeat FLP and CMP annually      Diabetes mellitus, type II ([place]) - Primary    Well controlled  Assoc with/ complicated by HTN, HLD Continue current medications UTD on vaccines, eye exam (ROI sent), foot exam (completed today) On ACEi/ARB On Statin Discussed diet and exercise F/u in 6 months       Relevant Orders   POCT glycosylated hemoglobin (Hb A1C) (Completed)   Hypothyroidism    Previously well  controlled Continue Synthroid at current dose  Recheck TSH at next visit         Other   Obesity    Discussed importance of healthy weight management Discussed diet and exercise         Return in about 6 months (around 10/29/2022) for AWV, CPE.      I, Lavon Paganini, MD, have reviewed all documentation for this visit. The documentation on 04/30/22 for the exam, diagnosis, procedures, and orders are all accurate and complete.   [place], [name], MD, MPH [facility]

## 2022-04-30 ENCOUNTER — Ambulatory Visit: Payer: Medicare PPO | Admitting: Family Medicine

## 2022-04-30 ENCOUNTER — Encounter: Payer: Self-pay | Admitting: Family Medicine

## 2022-04-30 VITALS — BP 134/82 | HR 93 | Temp 98.9°F | Resp 16 | Wt 189.0 lb

## 2022-04-30 DIAGNOSIS — E1169 Type 2 diabetes mellitus with other specified complication: Secondary | ICD-10-CM

## 2022-04-30 DIAGNOSIS — E785 Hyperlipidemia, unspecified: Secondary | ICD-10-CM

## 2022-04-30 DIAGNOSIS — I152 Hypertension secondary to endocrine disorders: Secondary | ICD-10-CM | POA: Diagnosis not present

## 2022-04-30 DIAGNOSIS — E1159 Type 2 diabetes mellitus with other circulatory complications: Secondary | ICD-10-CM | POA: Diagnosis not present

## 2022-04-30 DIAGNOSIS — E039 Hypothyroidism, unspecified: Secondary | ICD-10-CM | POA: Diagnosis not present

## 2022-04-30 DIAGNOSIS — E669 Obesity, unspecified: Secondary | ICD-10-CM | POA: Diagnosis not present

## 2022-04-30 DIAGNOSIS — Z6832 Body mass index (BMI) 32.0-32.9, adult: Secondary | ICD-10-CM

## 2022-04-30 LAB — POCT GLYCOSYLATED HEMOGLOBIN (HGB A1C)
Est. average glucose Bld gHb Est-mCnc: 146
Hemoglobin A1C: 6.7 % — AB (ref 4.0–5.6)

## 2022-04-30 NOTE — Assessment & Plan Note (Signed)
Well controlled Continue current medications Reviewed recent metabolic panel 

## 2022-04-30 NOTE — Assessment & Plan Note (Signed)
Well controlled  Assoc with/ complicated by HTN, HLD Continue current medications UTD on vaccines, eye exam (ROI sent), foot exam (completed today) On ACEi/ARB On Statin Discussed diet and exercise F/u in 6 months

## 2022-04-30 NOTE — Assessment & Plan Note (Signed)
Previously well controlled Continue statin Repeat FLP and CMP annually

## 2022-04-30 NOTE — Assessment & Plan Note (Signed)
Discussed importance of healthy weight management Discussed diet and exercise  

## 2022-04-30 NOTE — Assessment & Plan Note (Signed)
Previously well controlled Continue Synthroid at current dose  Recheck TSH at next visit

## 2022-05-30 ENCOUNTER — Other Ambulatory Visit: Payer: Self-pay | Admitting: Family Medicine

## 2022-05-30 DIAGNOSIS — I10 Essential (primary) hypertension: Secondary | ICD-10-CM

## 2022-05-31 NOTE — Telephone Encounter (Signed)
Requested Prescriptions  Pending Prescriptions Disp Refills   losartan (COZAAR) 100 MG tablet [Pharmacy Med Name: LOSARTAN '100MG'$  TABLETS] 90 tablet 1    Sig: TAKE 1 TABLET(100 MG) BY MOUTH DAILY     Cardiovascular:  Angiotensin Receptor Blockers Failed - 05/30/2022  7:10 PM      Failed - K in normal range and within 180 days    Potassium  Date Value Ref Range Status  02/05/2022 3.4 (L) 3.5 - 5.1 mmol/L Final         Passed - Cr in normal range and within 180 days    Creatinine, Ser  Date Value Ref Range Status  02/05/2022 0.81 0.44 - 1.00 mg/dL Final         Passed - Patient is not pregnant      Passed - Last BP in normal range    BP Readings from Last 1 Encounters:  04/30/22 134/82         Passed - Valid encounter within last 6 months    Recent Outpatient Visits           1 month ago Type 2 diabetes mellitus with other specified complication, without long-term current use of insulin ([place])   [facility] [place], [name], MD   7 months ago Medicare annual wellness visit, subsequent   [facility] [place], [name], MD   1 year ago Hypertension associated with diabetes [facility])   [facility], [name], MD   1 year ago Medicare annual wellness visit, subsequent   [place], [place], [place]   2 years ago Type 2 diabetes mellitus without complication, without long-term current use of insulin [facility])   [place], [name], [place]       Future Appointments             In 5 months [place], [name], MD [facility]

## 2022-07-01 DIAGNOSIS — J069 Acute upper respiratory infection, unspecified: Secondary | ICD-10-CM | POA: Diagnosis not present

## 2022-07-01 DIAGNOSIS — U071 COVID-19: Secondary | ICD-10-CM | POA: Diagnosis not present

## 2022-07-16 ENCOUNTER — Other Ambulatory Visit: Payer: Self-pay | Admitting: Family Medicine

## 2022-07-16 DIAGNOSIS — I10 Essential (primary) hypertension: Secondary | ICD-10-CM

## 2022-07-16 DIAGNOSIS — E1169 Type 2 diabetes mellitus with other specified complication: Secondary | ICD-10-CM

## 2022-08-22 DIAGNOSIS — E119 Type 2 diabetes mellitus without complications: Secondary | ICD-10-CM | POA: Diagnosis not present

## 2022-08-22 LAB — HM DIABETES EYE EXAM

## 2022-10-14 ENCOUNTER — Other Ambulatory Visit: Payer: Self-pay | Admitting: Family Medicine

## 2022-10-14 DIAGNOSIS — I10 Essential (primary) hypertension: Secondary | ICD-10-CM

## 2022-11-01 ENCOUNTER — Encounter: Payer: Medicare PPO | Admitting: Family Medicine

## 2022-11-13 ENCOUNTER — Other Ambulatory Visit: Payer: Self-pay | Admitting: Family Medicine

## 2022-11-13 DIAGNOSIS — I10 Essential (primary) hypertension: Secondary | ICD-10-CM

## 2022-11-14 ENCOUNTER — Ambulatory Visit (INDEPENDENT_AMBULATORY_CARE_PROVIDER_SITE_OTHER): Payer: Medicare PPO | Admitting: Family Medicine

## 2022-11-14 ENCOUNTER — Encounter: Payer: Self-pay | Admitting: Family Medicine

## 2022-11-14 VITALS — BP 148/88 | HR 86 | Temp 98.5°F | Resp 14 | Ht 64.0 in | Wt 185.6 lb

## 2022-11-14 DIAGNOSIS — Z832 Family history of diseases of the blood and blood-forming organs and certain disorders involving the immune mechanism: Secondary | ICD-10-CM

## 2022-11-14 DIAGNOSIS — I152 Hypertension secondary to endocrine disorders: Secondary | ICD-10-CM | POA: Diagnosis not present

## 2022-11-14 DIAGNOSIS — E1159 Type 2 diabetes mellitus with other circulatory complications: Secondary | ICD-10-CM

## 2022-11-14 DIAGNOSIS — E1169 Type 2 diabetes mellitus with other specified complication: Secondary | ICD-10-CM

## 2022-11-14 DIAGNOSIS — Z0000 Encounter for general adult medical examination without abnormal findings: Secondary | ICD-10-CM | POA: Diagnosis not present

## 2022-11-14 DIAGNOSIS — E785 Hyperlipidemia, unspecified: Secondary | ICD-10-CM | POA: Diagnosis not present

## 2022-11-14 DIAGNOSIS — Z1231 Encounter for screening mammogram for malignant neoplasm of breast: Secondary | ICD-10-CM

## 2022-11-14 DIAGNOSIS — Z1211 Encounter for screening for malignant neoplasm of colon: Secondary | ICD-10-CM

## 2022-11-14 DIAGNOSIS — D509 Iron deficiency anemia, unspecified: Secondary | ICD-10-CM | POA: Diagnosis not present

## 2022-11-14 DIAGNOSIS — Z7984 Long term (current) use of oral hypoglycemic drugs: Secondary | ICD-10-CM | POA: Diagnosis not present

## 2022-11-14 DIAGNOSIS — E039 Hypothyroidism, unspecified: Secondary | ICD-10-CM

## 2022-11-14 MED ORDER — AMLODIPINE BESYLATE 5 MG PO TABS
5.00 mg | ORAL_TABLET | Freq: Every day | ORAL | 1 refills | Status: DC
Start: 2022-11-14 — End: 2023-01-03

## 2022-11-14 MED ORDER — METFORMIN HCL ER 750 MG PO TB24: 750.0000 mg | ORAL_TABLET | Freq: Two times a day (BID) | ORAL | 2 refills | Status: DC

## 2022-11-14 NOTE — Assessment & Plan Note (Signed)
Ordered CBC w/diff.

## 2022-11-14 NOTE — Assessment & Plan Note (Addendum)
Previously well controlled. Due to GI side effects switch metformin to metformin XR 750 mg.  UTD on eye exam, foot exam, and UACr (completed today).  Patient recommended to get Tdap at pharmacy.  Ordered HgbA1c and UACr.

## 2022-11-14 NOTE — Assessment & Plan Note (Addendum)
Previously well controlled. Start amlodipine 5 mg daily. Switch metformin to metformin XR 750 mg. Ordered CMP and lipid.

## 2022-11-14 NOTE — Progress Notes (Signed)
Annual Wellness Visit     Patient: Joyce Morton, Female    DOB: [birth year], 72 y.o.   MRN: [id number]  Subjective  Chief Complaint  Patient presents with   Annual Exam    Patient states she has been having diarrhea that comes and goes for about 6 months.     MALAISHA Morton is a 72 y.o. female who presents today for her Annual Wellness Visit. She reports consuming a general diet.  Exercises some.  She generally feels fairly well. She reports sleeping well. She does have additional problems to discuss today.   HPI Diarrhea  Intermittent diarrhea that has occurred at random frequency in the past 6 months.  The diarrhea is non-bloody and self resolves within 1 day.  She cannot connect the symptoms to any food or activity.  Wonders if it is connected to her starting synthroid at her last visit.       Medications: Outpatient Medications Prior to Visit  Medication Sig   Accu-Chek Softclix Lancets lancets Use as instructed   acetaminophen (TYLENOL) 500 MG tablet Take 1,000 mg by mouth every 6 (six) hours as needed.   atorvastatin (LIPITOR) 40 MG tablet TAKE 1 TABLET(40 MG) BY MOUTH DAILY AT 6 PM   blood glucose meter kit and supplies KIT Dispense based on patient and insurance preference. Use 2-4 x weekly.   chlorthalidone (HYGROTON) 25 MG tablet TAKE 1 TABLET(25 MG) BY MOUTH DAILY   glucose blood (ACCU-CHEK AVIVA PLUS) test strip To check blood sugar daily   HYDROcodone-acetaminophen (NORCO/VICODIN) 5-325 MG tablet Take 1 tablet by mouth every 4 (four) hours as needed for moderate pain.   levothyroxine (SYNTHROID) 50 MCG tablet TAKE 1 TABLET(50 MCG) BY MOUTH DAILY   losartan (COZAAR) 100 MG tablet TAKE 1 TABLET(100 MG) BY MOUTH DAILY   Multiple Vitamin (MULTIVITAMIN) tablet Take 1 tablet by mouth daily.   [DISCONTINUED] famotidine (PEPCID) 10 MG tablet Take 10 mg by mouth daily.   [DISCONTINUED] metFORMIN (GLUCOPHAGE) 1000 MG tablet TAKE 1 TABLET(1000 MG) BY MOUTH  TWICE DAILY WITH A MEAL   No facility-administered medications prior to visit.    Allergies  Allergen Reactions   Lisinopril Cough and Other (See Comments)    Patient unsure what happens when she takes it   Wellbutrin [Bupropion] Hives    Patient Care Team: Erasmo Downer, MD as PCP - General (Family Medicine)  Review of Systems  Constitutional:  Negative for chills and fever.  Respiratory:  Negative for shortness of breath.   Cardiovascular:  Negative for chest pain.  Gastrointestinal:  Positive for diarrhea. Negative for abdominal pain, blood in stool, constipation, heartburn, nausea and vomiting.  Genitourinary: Negative.   Musculoskeletal:  Negative for joint pain.  Skin:  Negative for rash.  Neurological:  Negative for tingling, sensory change, weakness and headaches.        Objective  BP (!) 148/88 (BP Location: Right Arm, Patient Position: Sitting, Cuff Size: Large)   Pulse 86   Temp 98.5 F (36.9 C) (Oral)   Resp 14   Ht 5\' 4"  (1.626 m)   Wt 185 lb 9.6 oz (84.2 kg)   SpO2 98%   BMI 31.86 kg/m    Physical Exam HENT:     Head: Normocephalic and atraumatic.  Cardiovascular:     Pulses: Normal pulses.     Heart sounds: Normal heart sounds.  Pulmonary:     Effort: Pulmonary effort is normal.     Breath sounds: Normal  breath sounds.  Abdominal:     General: Bowel sounds are normal.     Palpations: Abdomen is soft.  Musculoskeletal:     Cervical back: Normal range of motion.     Right lower leg: No edema.     Left lower leg: No edema.  Skin:    General: Skin is warm and dry.     Capillary Refill: Capillary refill takes less than 2 seconds.  Neurological:     Mental Status: She is alert and oriented to person, place, and time.       Most recent functional status assessment:    11/14/2022    9:18 AM  In your present state of health, do you have any difficulty performing the following activities:  Hearing? 0  Vision? 0  Difficulty  concentrating or making decisions? 0  Walking or climbing stairs? 0  Dressing or bathing? 0  Doing errands, shopping? 0   Most recent fall risk assessment:    11/14/2022    9:17 AM  Fall Risk   Falls in the past year? 0  Number falls in past yr: 0  Injury with Fall? 0  Risk for fall due to : No Fall Risks  Follow up Falls evaluation completed    Most recent depression screenings:    11/14/2022    9:17 AM 10/15/2021   10:07 AM  PHQ 2/9 Scores  PHQ - 2 Score 0 0  PHQ- 9 Score 3 1   Most recent cognitive screening:     No data to display         Most recent Audit-C alcohol use screening    11/14/2022    9:18 AM  Alcohol Use Disorder Test (AUDIT)  1. How often do you have a drink containing alcohol? 0  2. How many drinks containing alcohol do you have on a typical day when you are drinking? 0  3. How often do you have six or more drinks on one occasion? 0  AUDIT-C Score 0   A score of 3 or more in women, and 4 or more in men indicates increased risk for alcohol abuse, EXCEPT if all of the points are from question 1   Vision/Hearing Screen: No results found.    No results found for any visits on 11/14/22.    Assessment & Plan   Annual wellness visit done today including the all of the following: Reviewed patient's Family Medical History Reviewed and updated list of patient's medical providers Assessment of cognitive impairment was done Assessed patient's functional ability Established a written schedule for health screening services Health Risk Assessent Completed and Reviewed  Exercise Activities and Dietary recommendations  Goals   None     Immunization History  Administered Date(s) Administered   Fluad Quad(high Dose 65+) 04/22/2022   Influenza, High Dose Seasonal PF 04/07/2018, 03/29/2019   Influenza-Unspecified 04/15/2015, 04/04/2017, 04/12/2020   PFIZER Comirnaty(Gray Top)Covid-19 Tri-Sucrose Vaccine 11/07/2020   PFIZER(Purple Top)SARS-COV-2  Vaccination 07/23/2019, 08/13/2019, 03/27/2020   Pneumococcal Conjugate-13 05/29/2015   Pneumococcal Polysaccharide-23 12/03/2016   Zoster Recombinat (Shingrix) 01/08/2018, 03/19/2018    Health Maintenance  Topic Date Due   DTaP/Tdap/Td (1 - Tdap) Never done   COLONOSCOPY (Pts 45-49yrs Insurance coverage will need to be confirmed)  01/16/2022   OPHTHALMOLOGY EXAM  02/15/2022   COVID-19 Vaccine (5 - 2023-24 season) 03/01/2022   Diabetic kidney evaluation - Urine ACR  10/16/2022   HEMOGLOBIN A1C  10/29/2022   INFLUENZA VACCINE  01/30/2023   Diabetic kidney  evaluation - eGFR measurement  02/06/2023   FOOT EXAM  05/01/2023   Medicare Annual Wellness (AWV)  11/14/2023   MAMMOGRAM  12/20/2023   Pneumonia Vaccine 57+ Years old  Completed   DEXA SCAN  Completed   Hepatitis C Screening  Completed   Zoster Vaccines- Shingrix  Completed   HPV VACCINES  Aged Out     Discussed health benefits of physical activity, and encouraged her to engage in regular exercise appropriate for her age and condition.    Problem List Items Addressed This Visit     Hypertension associated with diabetes (HCC)    Previously well controled. Start amlodipine 5 mg daily. Ordered CMP and lipid panel.  F/u in 1 month for BP check.      Relevant Medications   metFORMIN (GLUCOPHAGE-XR) 750 MG 24 hr tablet   amLODipine (NORVASC) 5 MG tablet   Other Relevant Orders   Comprehensive metabolic panel   Lipid panel   Hyperlipidemia associated with type 2 diabetes mellitus (HCC) Previously well controlled. Continue statin.  Ordered CMP and lipid panel. Goal LDL < 70.   Relevant Medications   metFORMIN (GLUCOPHAGE-XR) 750 MG 24 hr tablet   amLODipine (NORVASC) 5 MG tablet   Other Relevant Orders   Comprehensive metabolic panel   Lipid panel   Diabetes mellitus, type II (HCC) Previously well controlled. Last A1c 6.7%. Assoc. With / complicated by HTN, HLD. Due to GI side effects switch metformin to metformin  XR 750 mg.  UTD on eye exam, foot exam, and UACr (completed today).  Patient recommended to get Tdap at pharmacy.  On statin. Ordered HgbA1c and UACr. Discussed diet and exercise.     Relevant Medications   metFORMIN (GLUCOPHAGE-XR) 750 MG 24 hr tablet   Other Relevant Orders   Hemoglobin A1c   Urine Microalbumin w/creat. ratio   Hypothyroidism Previously well controlled.  Continue Synthroid at current dose. Ordered TSH and adjust synthroid as needed.     Relevant Orders   TSH   Family history of thalassemia   Relevant Orders   CBC with Differential/Platelet   Other Visit Diagnoses     Encounter for annual wellness visit (AWV) in Medicare patient    -  Primary   Encounter for annual physical exam       Relevant Orders   TSH   CBC with Differential/Platelet   Hemoglobin A1c   Comprehensive metabolic panel   Lipid panel   Colon cancer screening       Relevant Orders   Ambulatory referral to Gastroenterology   Microcytic anemia  Previous CBC showed microcytic anemia. Ordered CBC and Iron studies.       Relevant Orders   CBC with Differential/Platelet   Iron, TIBC and Ferritin Panel   Breast cancer screening by mammogram       Relevant Orders   MM 3D SCREENING MAMMOGRAM BILATERAL BREAST       Return in about 4 weeks (around 12/12/2022) for chronic disease f/u.     Ezekiel Slocumb MS3   Patient seen along with MS3 student Ezekiel Slocumb. I personally evaluated this patient along with the student, and verified all aspects of the history, physical exam, and medical decision making as documented by the student. I agree with the student's documentation and have made all necessary edits.  Lareta Bruneau, Marzella Schlein, MD, MPH [facility]

## 2022-11-14 NOTE — Assessment & Plan Note (Signed)
Previously well controlled. Continue statin.  Ordered CMP and lipid panel.

## 2022-11-14 NOTE — Assessment & Plan Note (Signed)
Previously well controlled.  Continue Synthroid at current dose. Ordered TSH.

## 2022-11-15 LAB — IRON,TIBC AND FERRITIN PANEL
Ferritin: 126 ng/mL (ref 15–150)
Iron Saturation: 16 % (ref 15–55)
Iron: 61 ug/dL (ref 27–139)
Total Iron Binding Capacity: 370 ug/dL (ref 250–450)
UIBC: 309 ug/dL (ref 118–369)

## 2022-11-15 LAB — CBC WITH DIFFERENTIAL/PLATELET
Basophils Absolute: 0 10*3/uL (ref 0.0–0.2)
Basos: 0 %
EOS (ABSOLUTE): 0.2 10*3/uL (ref 0.0–0.4)
Eos: 2 %
Hematocrit: 37.2 % (ref 34.0–46.6)
Hemoglobin: 11.3 g/dL (ref 11.1–15.9)
Immature Grans (Abs): 0.1 10*3/uL (ref 0.0–0.1)
Immature Granulocytes: 1 %
Lymphocytes Absolute: 2.5 10*3/uL (ref 0.7–3.1)
Lymphs: 28 %
MCH: 18.9 pg — ABNORMAL LOW (ref 26.6–33.0)
MCHC: 30.4 g/dL — ABNORMAL LOW (ref 31.5–35.7)
MCV: 62 fL — ABNORMAL LOW (ref 79–97)
Monocytes Absolute: 0.5 10*3/uL (ref 0.1–0.9)
Monocytes: 5 %
Neutrophils Absolute: 5.7 10*3/uL (ref 1.4–7.0)
Neutrophils: 64 %
Platelets: 446 10*3/uL (ref 150–450)
RBC: 5.99 x10E6/uL — ABNORMAL HIGH (ref 3.77–5.28)
RDW: 18.6 % — ABNORMAL HIGH (ref 11.7–15.4)
WBC: 9 10*3/uL (ref 3.4–10.8)

## 2022-11-15 LAB — COMPREHENSIVE METABOLIC PANEL
ALT: 30 IU/L (ref 0–32)
AST: 27 IU/L (ref 0–40)
Albumin/Globulin Ratio: 1.7 (ref 1.2–2.2)
Albumin: 4.7 g/dL (ref 3.8–4.8)
Alkaline Phosphatase: 75 IU/L (ref 44–121)
BUN/Creatinine Ratio: 23 (ref 12–28)
BUN: 19 mg/dL (ref 8–27)
Bilirubin Total: 0.7 mg/dL (ref 0.0–1.2)
CO2: 24 mmol/L (ref 20–29)
Calcium: 9.7 mg/dL (ref 8.7–10.3)
Chloride: 97 mmol/L (ref 96–106)
Creatinine, Ser: 0.84 mg/dL (ref 0.57–1.00)
Globulin, Total: 2.7 g/dL (ref 1.5–4.5)
Glucose: 136 mg/dL — ABNORMAL HIGH (ref 70–99)
Potassium: 4.2 mmol/L (ref 3.5–5.2)
Sodium: 141 mmol/L (ref 134–144)
Total Protein: 7.4 g/dL (ref 6.0–8.5)
eGFR: 74 mL/min/{1.73_m2} (ref >59)

## 2022-11-15 LAB — LIPID PANEL
Chol/HDL Ratio: 2.1 ratio (ref 0.0–4.4)
Cholesterol, Total: 145 mg/dL (ref 100–199)
HDL: 69 mg/dL (ref >39)
LDL Chol Calc (NIH): 61 mg/dL (ref 0–99)
Triglycerides: 77 mg/dL (ref 0–149)
VLDL Cholesterol Cal: 15 mg/dL (ref 5–40)

## 2022-11-15 LAB — HEMOGLOBIN A1C
Est. average glucose Bld gHb Est-mCnc: 148 mg/dL
Hgb A1c MFr Bld: 6.8 % — ABNORMAL HIGH (ref 4.8–5.6)

## 2022-11-15 LAB — MICROALBUMIN / CREATININE URINE RATIO
Creatinine, Urine: 71.9 mg/dL
Microalb/Creat Ratio: 13 mg/g creat (ref 0–29)
Microalbumin, Urine: 9 ug/mL

## 2022-11-15 LAB — TSH: TSH: 3.66 u[IU]/mL (ref 0.450–4.500)

## 2022-11-21 ENCOUNTER — Encounter: Payer: Self-pay | Admitting: *Deleted

## 2022-11-28 ENCOUNTER — Other Ambulatory Visit: Payer: Self-pay | Admitting: Family Medicine

## 2022-11-28 DIAGNOSIS — I10 Essential (primary) hypertension: Secondary | ICD-10-CM

## 2022-11-29 ENCOUNTER — Telehealth: Payer: Self-pay

## 2022-11-29 NOTE — Telephone Encounter (Signed)
PT left message to schedule colonoscopy please return call 

## 2022-12-02 ENCOUNTER — Telehealth: Payer: Self-pay

## 2022-12-02 DIAGNOSIS — Z86010 Personal history of colonic polyps: Secondary | ICD-10-CM

## 2022-12-02 NOTE — Telephone Encounter (Signed)
Returned patient's phone call to schedule her colonoscopy with Dr. Servando Snare.  LVM for pt to return my call.   Last colonoscopy was with Dr. Servando Snare 01/16/17 at [facility].  Polyps were noted. She is a diabetic based on chart review.  Takes Metformin which she will be advised to stop 2 days prior to her colonoscopy. No cardiac conditions.  Will await call back to schedule.  Thanks, [place], [place]

## 2022-12-03 ENCOUNTER — Other Ambulatory Visit: Payer: Self-pay

## 2022-12-03 DIAGNOSIS — Z86010 Personal history of colonic polyps: Secondary | ICD-10-CM

## 2022-12-03 MED ORDER — NA SULFATE-K SULFATE-MG SULF 17.5-3.13-1.6 GM/177ML PO SOLN: 1.0000 | Freq: Once | ORAL | 0 refills | Status: AC

## 2022-12-03 NOTE — Telephone Encounter (Signed)
Patient returned phone call to schedule her colonoscopy with Dr. Servando Snare.  She has been scheduled at [facility] on 01/13/23.  Advised her to stop Metformin 01/11/23. Prep sent to [facility] in [place].  Thanks,  [place], [place]

## 2022-12-03 NOTE — Addendum Note (Signed)
Addended by: Avie Arenas on: 12/03/2022 08:59 AM   Modules accepted: Orders

## 2022-12-23 ENCOUNTER — Ambulatory Visit: Payer: Medicare PPO | Admitting: Family Medicine

## 2023-01-03 ENCOUNTER — Encounter: Payer: Self-pay | Admitting: Family Medicine

## 2023-01-03 ENCOUNTER — Ambulatory Visit: Payer: Medicare PPO | Admitting: Family Medicine

## 2023-01-03 VITALS — BP 128/72 | HR 97 | Temp 97.8°F | Resp 16 | Ht 64.0 in | Wt 179.9 lb

## 2023-01-03 DIAGNOSIS — I10 Essential (primary) hypertension: Secondary | ICD-10-CM | POA: Diagnosis not present

## 2023-01-03 DIAGNOSIS — E1159 Type 2 diabetes mellitus with other circulatory complications: Secondary | ICD-10-CM | POA: Diagnosis not present

## 2023-01-03 DIAGNOSIS — I152 Hypertension secondary to endocrine disorders: Secondary | ICD-10-CM

## 2023-01-03 DIAGNOSIS — J0140 Acute pansinusitis, unspecified: Secondary | ICD-10-CM | POA: Diagnosis not present

## 2023-01-03 MED ORDER — LOSARTAN POTASSIUM 100 MG PO TABS: 100.0000 mg | ORAL_TABLET | Freq: Every day | ORAL | 1 refills | Status: DC

## 2023-01-03 MED ORDER — AMOXICILLIN-POT CLAVULANATE 875-125 MG PO TABS: 1.0000 | ORAL_TABLET | Freq: Two times a day (BID) | ORAL | 0 refills | Status: AC

## 2023-01-03 MED ORDER — AMLODIPINE BESYLATE 5 MG PO TABS: 5.0000 mg | ORAL_TABLET | Freq: Every day | ORAL | 1 refills | Status: DC

## 2023-01-03 MED ORDER — CHLORTHALIDONE 25 MG PO TABS: 25.0000 mg | ORAL_TABLET | Freq: Every day | ORAL | 1 refills | Status: DC

## 2023-01-03 NOTE — Assessment & Plan Note (Signed)
Well controlled Continue current medications Reviewed recent metabolic panel 

## 2023-01-03 NOTE — Progress Notes (Signed)
Established patient visit   Patient: Joyce Morton   DOB: Feb 24, 1951   72 y.o. Female  MRN: [id number] Visit Date: 01/03/2023  Today's healthcare provider: Shirlee Latch, MD   Chief Complaint  Patient presents with   Hypertension   Subjective    HPI  Hypertension, follow-up  BP Readings from Last 3 Encounters:  01/03/23 128/72  11/14/22 (!) 148/88  04/30/22 134/82   Wt Readings from Last 3 Encounters:  01/03/23 179 lb 14.4 oz (81.6 kg)  11/14/22 185 lb 9.6 oz (84.2 kg)  04/30/22 189 lb (85.7 kg)     She was last seen for hypertension 2 months ago.  BP at that visit was 148/88. Management since that visit includes start amlodipine 5 mg daily.  She reports excellent compliance with treatment. She is not having side effects.   Outside blood pressures are all over the place. Symptoms: No chest pain No chest pressure  No palpitations No syncope  No dyspnea No orthopnea  No paroxysmal nocturnal dyspnea No lower extremity edema   Pertinent labs Lab Results  Component Value Date   CHOL 145 11/14/2022   HDL 69 11/14/2022   LDLCALC 61 11/14/2022   TRIG 77 11/14/2022   CHOLHDL 2.1 11/14/2022   Lab Results  Component Value Date   NA 141 11/14/2022   K 4.2 11/14/2022   CREATININE 0.84 11/14/2022   EGFR 74 11/14/2022   GLUCOSE 136 (H) 11/14/2022   TSH 3.660 11/14/2022     The 10-year ASCVD risk score (Arnett DK, et al., 2019) is: 25.6%  ---------------------------------------------------------------------------------------------------  Discussed the use of AI scribe software for clinical note transcription with the patient, who gave verbal consent to proceed.  History of Present Illness   The patient, with a history of hypertension, presents for a follow-up visit after starting amlodipine 5 mg daily two months ago. She reports no issues with the medication and denies any leg swelling. She notes an improvement in her leg condition since starting the  medication and following a recent surgery. Her blood pressure reading during the visit was 128/72, which is within the target range.  In addition to her hypertension, the patient has been dealing with a cold for the past two weeks. The cold started with chest discomfort and a sore throat, which then progressed to a cough. The cough has been improving, but she still has nasal congestion and sinus pressure. She has not had any relief despite time and over-the-counter treatments.        Medications: Outpatient Medications Prior to Visit  Medication Sig   Accu-Chek Softclix Lancets lancets Use as instructed   acetaminophen (TYLENOL) 500 MG tablet Take 1,000 mg by mouth every 6 (six) hours as needed.   atorvastatin (LIPITOR) 40 MG tablet TAKE 1 TABLET(40 MG) BY MOUTH DAILY AT 6 PM   blood glucose meter kit and supplies KIT Dispense based on patient and insurance preference. Use 2-4 x weekly.   glucose blood (ACCU-CHEK AVIVA PLUS) test strip To check blood sugar daily   HYDROcodone-acetaminophen (NORCO/VICODIN) 5-325 MG tablet Take 1 tablet by mouth every 4 (four) hours as needed for moderate pain.   levothyroxine (SYNTHROID) 50 MCG tablet TAKE 1 TABLET(50 MCG) BY MOUTH DAILY   metFORMIN (GLUCOPHAGE-XR) 750 MG 24 hr tablet Take 1 tablet (750 mg total) by mouth 2 (two) times daily.   Multiple Vitamin (MULTIVITAMIN) tablet Take 1 tablet by mouth daily.   [DISCONTINUED] amLODipine (NORVASC) 5 MG tablet Take 1  tablet (5 mg total) by mouth daily.   [DISCONTINUED] chlorthalidone (HYGROTON) 25 MG tablet TAKE 1 TABLET(25 MG) BY MOUTH DAILY   [DISCONTINUED] losartan (COZAAR) 100 MG tablet TAKE 1 TABLET(100 MG) BY MOUTH DAILY   No facility-administered medications prior to visit.    Review of Systems per HPI     Objective    BP 128/72 (BP Location: Left Arm, Patient Position: Sitting, Cuff Size: Large)   Pulse 97   Temp 97.8 F (36.6 C) (Temporal)   Resp 16   Ht 5\' 4"  (1.626 m)   Wt 179 lb 14.4  oz (81.6 kg)   SpO2 97%   BMI 30.88 kg/m  BP Readings from Last 3 Encounters:  01/03/23 128/72  11/14/22 (!) 148/88  04/30/22 134/82   Wt Readings from Last 3 Encounters:  01/03/23 179 lb 14.4 oz (81.6 kg)  11/14/22 185 lb 9.6 oz (84.2 kg)  04/30/22 189 lb (85.7 kg)      Physical Exam Vitals reviewed.  Constitutional:      General: She is not in acute distress.    Appearance: Normal appearance. She is well-developed. She is not diaphoretic.  HENT:     Head: Normocephalic and atraumatic.  Eyes:     General: No scleral icterus.    Conjunctiva/sclera: Conjunctivae normal.  Neck:     Thyroid: No thyromegaly.  Cardiovascular:     Rate and Rhythm: Normal rate and regular rhythm.     Heart sounds: Normal heart sounds. No murmur heard. Pulmonary:     Effort: Pulmonary effort is normal. No respiratory distress.     Breath sounds: Normal breath sounds. No wheezing, rhonchi or rales.  Musculoskeletal:     Cervical back: Neck supple.     Right lower leg: No edema.     Left lower leg: No edema.  Lymphadenopathy:     Cervical: No cervical adenopathy.  Skin:    General: Skin is warm and dry.     Findings: No rash.  Neurological:     Mental Status: She is alert and oriented to person, place, and time. Mental status is at baseline.  Psychiatric:        Mood and Affect: Mood normal.        Behavior: Behavior normal.       No results found for any visits on 01/03/23.  Assessment & Plan     Problem List Items Addressed This Visit       Cardiovascular and Mediastinum   Hypertension associated with diabetes (HCC) - Primary    Well controlled Continue current medications Reviewed recent metabolic panel      Relevant Medications   amLODipine (NORVASC) 5 MG tablet   chlorthalidone (HYGROTON) 25 MG tablet   losartan (COZAAR) 100 MG tablet   Other Visit Diagnoses     Hypertension, essential       Relevant Medications   amLODipine (NORVASC) 5 MG tablet   chlorthalidone  (HYGROTON) 25 MG tablet   losartan (COZAAR) 100 MG tablet   Acute non-recurrent pansinusitis       Relevant Medications   amoxicillin-clavulanate (AUGMENTIN) 875-125 MG tablet      - symptoms and exam c/w sinusitis   - no evidence of AOM, CAP, strep pharyngitis, or other infection - given duration of symptoms, suspect bacterial etiology - will treat with Augmentin x7d - discussed symptomatic management (flonase, decongestants, etc), natural course, and return precautions    Return in about 5 months (around 06/05/2023) for chronic disease f/u.  I, Shirlee Latch, MD, have reviewed all documentation for this visit. The documentation on 01/03/23 for the exam, diagnosis, procedures, and orders are all accurate and complete.   Meshia Rau, Marzella Schlein, MD, MPH [facility]

## 2023-01-06 ENCOUNTER — Other Ambulatory Visit: Payer: Self-pay | Admitting: Family Medicine

## 2023-01-06 ENCOUNTER — Encounter: Payer: Self-pay | Admitting: Gastroenterology

## 2023-01-09 ENCOUNTER — Ambulatory Visit
Admission: RE | Admit: 2023-01-09 | Discharge: 2023-01-09 | Disposition: A | Discharge status: Home / Self Care | Payer: Medicare PPO | Source: Ambulatory Visit | Attending: Family Medicine | Admitting: Family Medicine

## 2023-01-09 ENCOUNTER — Other Ambulatory Visit: Payer: Self-pay | Admitting: Family Medicine

## 2023-01-09 DIAGNOSIS — Z1231 Encounter for screening mammogram for malignant neoplasm of breast: Secondary | ICD-10-CM | POA: Diagnosis not present

## 2023-01-09 DIAGNOSIS — E1169 Type 2 diabetes mellitus with other specified complication: Secondary | ICD-10-CM

## 2023-01-09 NOTE — Anesthesia Preprocedure Evaluation (Addendum)
Anesthesia Evaluation  Patient identified by MRN, date of birth, ID band Patient awake    Reviewed: Allergy & Precautions, H&P , NPO status , Patient's Chart, lab work & pertinent test results  Airway Mallampati: IV  TM Distance: <3 FB Neck ROM: Full    Dental no notable dental hx.  Teeth are crooked:   Pulmonary former smoker   Pulmonary exam normal breath sounds clear to auscultation       Cardiovascular hypertension, Normal cardiovascular exam Rhythm:Regular Rate:Normal     Neuro/Psych   Anxiety     negative neurological ROS  negative psych ROS   GI/Hepatic negative GI ROS, Neg liver ROS,GERD  ,,  Endo/Other  diabetesHypothyroidism    Renal/GU negative Renal ROS  negative genitourinary   Musculoskeletal negative musculoskeletal ROS (+)    Abdominal   Peds negative pediatric ROS (+)  Hematology negative hematology ROS (+)   Anesthesia Other Findings Hypothyroidism Diabetes mellitus, type II (HCC) Hypertension associated with diabetes  Hyperlipidemia associated with type 2 diabetes mellitus  Hypertension GERD (gastroesophageal reflux disease) Anxiety Vertigo  Reproductive/Obstetrics negative OB ROS                             Anesthesia Physical Anesthesia Plan  ASA: 2  Anesthesia Plan: General   Post-op Pain Management:    Induction: Intravenous  PONV Risk Score and Plan:   Airway Management Planned: Natural Airway and Nasal Cannula  Additional Equipment:   Intra-op Plan:   Post-operative Plan:   Informed Consent: I have reviewed the patients History and Physical, chart, labs and discussed the procedure including the risks, benefits and alternatives for the proposed anesthesia with the patient or authorized representative who has indicated his/her understanding and acceptance.     Dental Advisory Given  Plan Discussed with: Anesthesiologist, CRNA and  Surgeon  Anesthesia Plan Comments: (Patient consented for risks of anesthesia including but not limited to:  - adverse reactions to medications - risk of airway placement if required - damage to eyes, teeth, lips or other oral mucosa - nerve damage due to positioning  - sore throat or hoarseness - Damage to heart, brain, nerves, lungs, other parts of body or loss of life  Patient voiced understanding.)       Anesthesia Quick Evaluation

## 2023-01-13 ENCOUNTER — Ambulatory Visit: Payer: Medicare PPO | Admitting: Anesthesiology

## 2023-01-13 ENCOUNTER — Encounter
Admission: RE | Disposition: A | Discharge status: Home / Self Care | Payer: Self-pay | Source: Home / Self Care | Attending: Gastroenterology

## 2023-01-13 ENCOUNTER — Encounter: Payer: Self-pay | Admitting: Gastroenterology

## 2023-01-13 ENCOUNTER — Other Ambulatory Visit: Payer: Self-pay

## 2023-01-13 ENCOUNTER — Ambulatory Visit
Admission: RE | Admit: 2023-01-13 | Discharge: 2023-01-13 | Disposition: A | Discharge status: Home / Self Care | Payer: Medicare PPO | Attending: Gastroenterology | Admitting: Gastroenterology

## 2023-01-13 DIAGNOSIS — Z1211 Encounter for screening for malignant neoplasm of colon: Secondary | ICD-10-CM | POA: Diagnosis not present

## 2023-01-13 DIAGNOSIS — E1169 Type 2 diabetes mellitus with other specified complication: Secondary | ICD-10-CM | POA: Diagnosis not present

## 2023-01-13 DIAGNOSIS — K640 First degree hemorrhoids: Secondary | ICD-10-CM | POA: Diagnosis not present

## 2023-01-13 DIAGNOSIS — E039 Hypothyroidism, unspecified: Secondary | ICD-10-CM | POA: Insufficient documentation

## 2023-01-13 DIAGNOSIS — I10 Essential (primary) hypertension: Secondary | ICD-10-CM | POA: Insufficient documentation

## 2023-01-13 DIAGNOSIS — Z860100 Personal history of colon polyps, unspecified: Secondary | ICD-10-CM

## 2023-01-13 DIAGNOSIS — Z87891 Personal history of nicotine dependence: Secondary | ICD-10-CM | POA: Diagnosis not present

## 2023-01-13 DIAGNOSIS — K635 Polyp of colon: Secondary | ICD-10-CM | POA: Insufficient documentation

## 2023-01-13 DIAGNOSIS — Z7984 Long term (current) use of oral hypoglycemic drugs: Secondary | ICD-10-CM | POA: Diagnosis not present

## 2023-01-13 DIAGNOSIS — Z09 Encounter for follow-up examination after completed treatment for conditions other than malignant neoplasm: Secondary | ICD-10-CM | POA: Diagnosis not present

## 2023-01-13 DIAGNOSIS — K648 Other hemorrhoids: Secondary | ICD-10-CM | POA: Diagnosis not present

## 2023-01-13 HISTORY — PX: COLONOSCOPY WITH PROPOFOL: SHX5780

## 2023-01-13 LAB — GLUCOSE, CAPILLARY: Glucose-Capillary: 145 mg/dL — ABNORMAL HIGH (ref 70–99)

## 2023-01-13 SURGERY — COLONOSCOPY WITH PROPOFOL
Anesthesia: General | Site: Rectum

## 2023-01-13 MED ORDER — LACTATED RINGERS IV SOLN: INTRAVENOUS | Status: DC

## 2023-01-13 MED ORDER — PROPOFOL 10 MG/ML IV BOLUS
INTRAVENOUS | Status: DC | PRN
  Administered 2023-01-13: 25 mg via INTRAVENOUS
  Administered 2023-01-13: 100 mg via INTRAVENOUS
  Administered 2023-01-13: 25 mg via INTRAVENOUS

## 2023-01-13 MED ORDER — STERILE WATER FOR IRRIGATION IR SOLN: Status: DC | PRN

## 2023-01-13 MED ORDER — STERILE WATER FOR IRRIGATION IR SOLN
Status: DC | PRN
  Administered 2023-01-13: 1000 mL

## 2023-01-13 MED ORDER — SODIUM CHLORIDE 0.9 % IV SOLN: INTRAVENOUS | Status: DC

## 2023-01-13 SURGICAL SUPPLY — 21 items

## 2023-01-13 NOTE — H&P (Signed)
[name], MD [facility] [address], [address] [place], [place] [address] [phone] Fax : [phone]  Primary Care Physician:  Erasmo Downer, MD Primary Gastroenterologist:  Dr. Servando Snare  Pre-Procedure History & Physical: HPI:  Joyce Morton is a 72 y.o. female is here for an colonoscopy.   Past Medical History:  Diagnosis Date   Anxiety    Diabetes mellitus without complication (HCC)    GERD (gastroesophageal reflux disease)    Hyperlipidemia    Hypertension    Hypothyroidism    Vertigo    only a few brief episodes at night    Past Surgical History:  Procedure Laterality Date   APPENDECTOMY     as a child   CATARACT EXTRACTION, BILATERAL Bilateral 07/2021   COLONOSCOPY WITH PROPOFOL N/A 01/16/2017   Procedure: COLONOSCOPY WITH PROPOFOL;  Surgeon: [name], MD;  Location: [facility];  Service: Endoscopy;  Laterality: N/A;  Diabetic - oral meds   KNEE ARTHROSCOPY WITH MENISCAL REPAIR Left 02/07/2022   Procedure: Left knee lateral meniscus tear repair and small medial meniscus tear repair;  Surgeon: Kennedy Bucker, MD;  Location: [facility];  Service: Orthopedics;  Laterality: Left;   POLYPECTOMY N/A 01/16/2017   Procedure: POLYPECTOMY;  Surgeon: [name], MD;  Location: [facility];  Service: Endoscopy;  Laterality: N/A;    Prior to Admission medications   Medication Sig Start Date End Date Taking? Authorizing Provider  Accu-Chek Softclix Lancets lancets Use as instructed 08/09/20  Yes Burnette, Alessandra Bevels, PA-C  acetaminophen (TYLENOL) 500 MG tablet Take 1,000 mg by mouth every 6 (six) hours as needed.   Yes [provider]  amLODipine (NORVASC) 5 MG tablet Take 1 tablet (5 mg total) by mouth daily. 01/03/23  Yes Bacigalupo, Marzella Schlein, MD  atorvastatin (LIPITOR) 40 MG tablet TAKE 1 TABLET(40 MG) BY MOUTH DAILY AT 6 PM 01/09/23  Yes Bacigalupo, Marzella Schlein, MD  blood glucose meter kit and supplies KIT Dispense based on patient and  insurance preference. Use 2-4 x weekly. 04/14/17  Yes Anola Gurney, PA  chlorthalidone (HYGROTON) 25 MG tablet Take 1 tablet (25 mg total) by mouth daily. 01/03/23  Yes Bacigalupo, Marzella Schlein, MD  glucose blood (ACCU-CHEK AVIVA PLUS) test strip To check blood sugar daily 08/04/20  Yes Margaretann Loveless, PA-C  levothyroxine (SYNTHROID) 50 MCG tablet TAKE 1 TABLET(50 MCG) BY MOUTH DAILY 11/13/22  Yes Bacigalupo, Marzella Schlein, MD  losartan (COZAAR) 100 MG tablet Take 1 tablet (100 mg total) by mouth daily. 01/03/23  Yes Bacigalupo, Marzella Schlein, MD  metFORMIN (GLUCOPHAGE-XR) 750 MG 24 hr tablet Take 1 tablet (750 mg total) by mouth 2 (two) times daily. 11/14/22  Yes Bacigalupo, Marzella Schlein, MD  Multiple Vitamin (MULTIVITAMIN) tablet Take 1 tablet by mouth daily.   Yes [provider]  HYDROcodone-acetaminophen (NORCO/VICODIN) 5-325 MG tablet Take 1 tablet by mouth every 4 (four) hours as needed for moderate pain. 02/07/22 02/07/23  Kennedy Bucker, MD    Allergies as of 12/03/2022 - Review Complete 11/14/2022  Allergen Reaction Noted   Lisinopril Cough and Other (See Comments) 05/02/2015   Wellbutrin [bupropion] Hives 05/02/2015    Family History  Problem Relation Age of Onset   Dementia Mother    Prostate cancer Father    Healthy Sister    Healthy Sister    Healthy Sister    Breast cancer Neg Hx    Colon cancer Neg Hx    Ovarian cancer Neg Hx  Social History   Socioeconomic History   Marital status: Married    Spouse name: Not on file   Number of children: Not on file   Years of education: Not on file   Highest education level: Some college, no degree  Occupational History   Not on file  Tobacco Use   Smoking status: Former    Current packs/day: 0.00    Types: Cigarettes    Quit date: 2008    Years since quitting: 16.5   Smokeless tobacco: Never  Vaping Use   Vaping status: Never Used  Substance and Sexual Activity   Alcohol use: No   Drug use: No   Sexual activity: Not on  file  Other Topics Concern   Not on file  Social History Narrative   Not on file   Social Determinants of Health   Financial Resource Strain: Low Risk  (01/01/2023)   Overall Financial Resource Strain (CARDIA)    Difficulty of Paying Living Expenses: Not hard at all  Food Insecurity: No Food Insecurity (01/01/2023)   Hunger Vital Sign    Worried About Running Out of Food in the Last Year: Never true    Ran Out of Food in the Last Year: Never true  Transportation Needs: No Transportation Needs (01/01/2023)   PRAPARE - Administrator, Civil Service (Medical): No    Lack of Transportation (Non-Medical): No  Physical Activity: Insufficiently Active (01/01/2023)   Exercise Vital Sign    Days of Exercise per Week: 3 days    Minutes of Exercise per Session: 30 min  Stress: No Stress Concern Present (01/01/2023)   [employer] of Occupational Health - Occupational Stress Questionnaire    Feeling of Stress : Not at all  Social Connections: Socially Integrated (01/01/2023)   Social Connection and Isolation Panel [NHANES]    Frequency of Communication with Friends and Family: Twice a week    Frequency of Social Gatherings with Friends and Family: More than three times a week    Attends Religious Services: More than 4 times per year    Active Member of [employer] or Organizations: Yes    Attends Engineer, structural: More than 4 times per year    Marital Status: Married  Catering manager Violence: Not on file    Review of Systems: See HPI, otherwise negative ROS  Physical Exam: BP 132/75   Pulse (!) 106   Temp (!) 97.5 F (36.4 C) (Temporal)   Resp (!) 22   Ht 5\' 4"  (1.626 m)   Wt 80.1 kg   SpO2 97%   BMI 30.30 kg/m  General:   Alert,  pleasant and cooperative in NAD Head:  Normocephalic and atraumatic. Neck:  Supple; no masses or thyromegaly. Lungs:  Clear throughout to auscultation.    Heart:  Regular rate and rhythm. Abdomen:  Soft, nontender and nondistended.  Normal bowel sounds, without guarding, and without rebound.   Neurologic:  Alert and  oriented x4;  grossly normal neurologically.  Impression/Plan: Joyce Morton is here for an colonoscopy to be performed for a history of adenomatous polyps on 2018   Risks, benefits, limitations, and alternatives regarding  colonoscopy have been reviewed with the patient.  Questions have been answered.  All parties agreeable.   [name], MD  01/13/2023, 8:28 AM

## 2023-01-13 NOTE — Anesthesia Postprocedure Evaluation (Signed)
Anesthesia Post Note  Patient: Joyce Morton  Procedure(s) Performed: COLONOSCOPY WITH PROPOFOL (Rectum)  Patient location during evaluation: PACU Anesthesia Type: General Level of consciousness: awake and alert Pain management: pain level controlled Vital Signs Assessment: post-procedure vital signs reviewed and stable Respiratory status: spontaneous breathing, nonlabored ventilation, respiratory function stable and patient connected to nasal cannula oxygen Cardiovascular status: blood pressure returned to baseline and stable Postop Assessment: no apparent nausea or vomiting Anesthetic complications: no   No notable events documented.   Last Vitals:  Vitals:   01/13/23 0930 01/13/23 0933  BP: 92/69 102/71  Pulse: 87 80  Resp: (!) 23 14  Temp:  36.4 C  SpO2: 95% 97%    Last Pain:  Vitals:   01/13/23 0933  TempSrc:   PainSc: 0-No pain                 Franki Alcaide C Hanan Mcwilliams

## 2023-01-13 NOTE — Transfer of Care (Signed)
Immediate Anesthesia Transfer of Care Note  Patient: Joyce Morton  Procedure(s) Performed: COLONOSCOPY WITH PROPOFOL (Rectum)  Patient Location: PACU  Anesthesia Type: General  Level of Consciousness: awake, alert  and patient cooperative  Airway and Oxygen Therapy: Patient Spontanous Breathing and Patient connected to supplemental oxygen  Post-op Assessment: Post-op Vital signs reviewed, Patient's Cardiovascular Status Stable, Respiratory Function Stable, Patent Airway and No signs of Nausea or vomiting  Post-op Vital Signs: Reviewed and stable  Complications: No notable events documented.

## 2023-01-13 NOTE — Op Note (Signed)
Joyce Morton Patient Name: Joyce Morton Procedure Date: 01/13/2023 8:55 AM MRN: [id number] Account #: [id number] Date of Birth: [birth year] Admit Type: Outpatient Age: 72 Room: [facility] OR ROOM 01 Gender: Female Note Status: Finalized Instrument Name: [id number] Procedure:             Colonoscopy Indications:           High risk colon cancer surveillance: Personal history                         of colonic polyps Providers:             [name] MD, MD Referring MD:          Marzella Schlein. Bacigalupo (Referring MD) Medicines:             Propofol per Anesthesia Complications:         No immediate complications. Procedure:             Pre-Anesthesia Assessment:                        - Prior to the procedure, a History and Physical was                         performed, and patient medications and allergies were                         reviewed. The patient's tolerance of previous                         anesthesia was also reviewed. The risks and benefits                         of the procedure and the sedation options and risks                         were discussed with the patient. All questions were                         answered, and informed consent was obtained. Prior                         Anticoagulants: The patient has taken no anticoagulant                         or antiplatelet agents. ASA Grade Assessment: II - A                         patient with mild systemic disease. After reviewing                         the risks and benefits, the patient was deemed in                         satisfactory condition to undergo the procedure.                        After obtaining informed consent, the colonoscope was  passed under direct vision. Throughout the procedure,                         the patient's blood pressure, pulse, and oxygen                         saturations were monitored continuously. The                          Colonoscope was introduced through the anus and                         advanced to the the cecum, identified by appendiceal                         orifice and ileocecal valve. The colonoscopy was                         performed without difficulty. The patient tolerated                         the procedure well. The quality of the bowel                         preparation was excellent. Findings:      The perianal and digital rectal examinations were normal.      A 3 mm polyp was found in the transverse colon. The polyp was sessile.       The polyp was removed with a cold snare. Resection and retrieval were       complete.      A 4 mm polyp was found in the sigmoid colon. The polyp was sessile. The       polyp was removed with a cold snare. Resection and retrieval were       complete.      Non-bleeding internal hemorrhoids were found during retroflexion. The       hemorrhoids were Grade I (internal hemorrhoids that do not prolapse). Impression:            - One 3 mm polyp in the transverse colon, removed with                         a cold snare. Resected and retrieved.                        - One 4 mm polyp in the sigmoid colon, removed with a                         cold snare. Resected and retrieved.                        - Non-bleeding internal hemorrhoids. Recommendation:        - Discharge patient to home.                        - Resume previous diet.                        - Continue present medications.                        -  Await pathology results.                        - Repeat colonoscopy is not recommended for                         surveillance. Procedure Code(s):     --- Professional ---                        [phone], Colonoscopy, flexible; with removal of                         tumor(s), polyp(s), or other lesion(s) by snare                         technique Diagnosis Code(s):     --- Professional ---                        Z86.010, Personal history  of colonic polyps                        D12.3, Benign neoplasm of transverse colon (hepatic                         flexure or splenic flexure) CPT copyright 2022 American Medical Association. All rights reserved. The codes documented in this report are preliminary and upon coder review may  be revised to meet current compliance requirements. [name] MD, MD 01/13/2023 9:20:39 AM This report has been signed electronically. Number of Addenda: 0 Note Initiated On: 01/13/2023 8:55 AM Scope Withdrawal Time: 0 hours 8 minutes 25 seconds  Total Procedure Duration: 0 hours 12 minutes 56 seconds  Estimated Blood Loss:  Estimated blood loss: none.      [facility]

## 2023-01-14 ENCOUNTER — Encounter: Payer: Self-pay | Admitting: Gastroenterology

## 2023-01-18 ENCOUNTER — Encounter: Payer: Self-pay | Admitting: Gastroenterology

## 2023-02-08 ENCOUNTER — Other Ambulatory Visit: Payer: Self-pay | Admitting: Family Medicine

## 2023-04-27 ENCOUNTER — Other Ambulatory Visit: Payer: Self-pay | Admitting: Family Medicine

## 2023-06-05 ENCOUNTER — Ambulatory Visit: Payer: Medicare PPO | Admitting: Family Medicine

## 2023-06-10 ENCOUNTER — Ambulatory Visit: Payer: Medicare PPO | Admitting: Family Medicine

## 2023-06-16 ENCOUNTER — Other Ambulatory Visit: Payer: Self-pay | Admitting: Family Medicine

## 2023-06-16 NOTE — Telephone Encounter (Signed)
Medication Refill -  Most Recent Primary Care Visit:  Provider: Erasmo Downer  Department: [facility]  Visit Type: OFFICE VISIT  Date: 01/03/2023  Medication: levothyroxine (SYNTHROID) 50 MCG tablet   Has the patient contacted their pharmacy? Yes Pharmacy said they never got the script in Oct.  Is this the correct pharmacy for this prescription? Yes  This is the patient's preferred pharmacy:  [id number] - [place], [place] - [address] AT [facility] [address] [place] [address] [place] [address] Phone: [phone] Fax: [phone]   Has the prescription been filled recently? Yes  Is the patient out of the medication? No  Has the patient been seen for an appointment in the last year OR does the patient have an upcoming appointment? Yes  Can we respond through MyChart? No  Agent: Please be advised that Rx refills may take up to 3 business days. We ask that you follow-up with your pharmacy.  Patient will be out of medication in 6 days. Please f/u with pharmacy for clarification.

## 2023-06-17 MED ORDER — LEVOTHYROXINE SODIUM 50 MCG PO TABS: 50.0000 ug | ORAL_TABLET | Freq: Every day | ORAL | 1 refills | Status: DC

## 2023-06-17 NOTE — Telephone Encounter (Signed)
Requested Prescriptions  Pending Prescriptions Disp Refills   levothyroxine (SYNTHROID) 50 MCG tablet 90 tablet 1    Sig: Take 1 tablet (50 mcg total) by mouth daily before breakfast.     Endocrinology:  Hypothyroid Agents Passed - 06/17/2023 10:27 AM      Passed - TSH in normal range and within 360 days    TSH  Date Value Ref Range Status  11/14/2022 3.660 0.450 - 4.500 uIU/mL Final         Passed - Valid encounter within last 12 months    Recent Outpatient Visits           5 months ago Hypertension associated with diabetes [facility])   [place] [facility] [place], Marzella Schlein, MD   7 months ago Encounter for annual wellness visit (AWV) in Medicare patient   [facility] [place], Marzella Schlein, MD   1 year ago Type 2 diabetes mellitus with other specified complication, without long-term current use of insulin [facility])   [place] [facility] [place], Marzella Schlein, MD   1 year ago Medicare annual wellness visit, subsequent   [place] [facility] Erasmo Downer, MD   2 years ago Hypertension associated with diabetes [facility])   [place] [facility] Bacigalupo, Marzella Schlein, MD       Future Appointments             In 2 weeks Bacigalupo, Marzella Schlein, MD [facility]

## 2023-06-23 ENCOUNTER — Encounter: Payer: Self-pay | Admitting: Family Medicine

## 2023-06-23 NOTE — Telephone Encounter (Signed)
 Care team updated and letter sent for eye exam notes.

## 2023-06-30 ENCOUNTER — Other Ambulatory Visit: Payer: Self-pay | Admitting: Family Medicine

## 2023-07-03 ENCOUNTER — Ambulatory Visit: Payer: Medicare PPO | Admitting: Family Medicine

## 2023-07-06 ENCOUNTER — Other Ambulatory Visit: Payer: Self-pay | Admitting: Family Medicine

## 2023-07-06 DIAGNOSIS — E1169 Type 2 diabetes mellitus with other specified complication: Secondary | ICD-10-CM

## 2023-07-08 NOTE — Telephone Encounter (Signed)
 Requested Prescriptions  Pending Prescriptions Disp Refills   atorvastatin  (LIPITOR) 40 MG tablet [Pharmacy Med Name: ATORVASTATIN  40MG  TABLETS] 90 tablet 1    Sig: TAKE 1 TABLET(40 MG) BY MOUTH DAILY AT 6 PM     Cardiovascular:  Antilipid - Statins Failed - 07/08/2023  3:36 PM      Failed - Lipid Panel in normal range within the last 12 months    Cholesterol, Total  Date Value Ref Range Status  11/14/2022 145 100 - 199 mg/dL Final   LDL Chol Calc (NIH)  Date Value Ref Range Status  11/14/2022 61 0 - 99 mg/dL Final   HDL  Date Value Ref Range Status  11/14/2022 69 >39 mg/dL Final   Triglycerides  Date Value Ref Range Status  11/14/2022 77 0 - 149 mg/dL Final         Passed - Patient is not pregnant      Passed - Valid encounter within last 12 months    Recent Outpatient Visits           6 months ago Hypertension associated with diabetes [facility])   [place] [facility] [place], Jon HERO, MD   7 months ago Encounter for annual wellness visit (AWV) in Medicare patient   [place] [facility] [place], Jon HERO, MD   1 year ago Type 2 diabetes mellitus with other specified complication, without long-term current use of insulin [facility])   [place] [facility] [place], Jon HERO, MD   1 year ago Medicare annual wellness visit, subsequent   [place] [facility] [place], Jon HERO, MD   2 years ago Hypertension associated with diabetes [facility])   [place] [facility] Bacigalupo, Jon HERO, MD       Future Appointments             In 6 days Bacigalupo, Jon HERO, MD [facility]

## 2023-07-14 ENCOUNTER — Encounter: Payer: Self-pay | Admitting: Family Medicine

## 2023-07-14 ENCOUNTER — Ambulatory Visit: Payer: Medicare PPO | Admitting: Family Medicine

## 2023-07-14 VITALS — BP 138/87 | HR 95 | Ht 64.0 in | Wt 179.0 lb

## 2023-07-14 DIAGNOSIS — E039 Hypothyroidism, unspecified: Secondary | ICD-10-CM | POA: Diagnosis not present

## 2023-07-14 DIAGNOSIS — E1159 Type 2 diabetes mellitus with other circulatory complications: Secondary | ICD-10-CM

## 2023-07-14 DIAGNOSIS — E66811 Obesity, class 1: Secondary | ICD-10-CM

## 2023-07-14 DIAGNOSIS — Z6830 Body mass index (BMI) 30.0-30.9, adult: Secondary | ICD-10-CM

## 2023-07-14 DIAGNOSIS — E119 Type 2 diabetes mellitus without complications: Secondary | ICD-10-CM

## 2023-07-14 DIAGNOSIS — I152 Hypertension secondary to endocrine disorders: Secondary | ICD-10-CM

## 2023-07-14 DIAGNOSIS — E785 Hyperlipidemia, unspecified: Secondary | ICD-10-CM

## 2023-07-14 DIAGNOSIS — E1169 Type 2 diabetes mellitus with other specified complication: Secondary | ICD-10-CM | POA: Diagnosis not present

## 2023-07-14 DIAGNOSIS — Z7984 Long term (current) use of oral hypoglycemic drugs: Secondary | ICD-10-CM

## 2023-07-14 MED ORDER — BENZONATATE 100 MG PO CAPS: 100.0000 mg | ORAL_CAPSULE | Freq: Two times a day (BID) | ORAL | 0 refills | Status: DC | PRN

## 2023-07-14 MED ORDER — ACCU-CHEK AVIVA PLUS VI STRP
ORAL_STRIP | 12 refills | Status: AC
Start: 2023-07-14 — End: ?

## 2023-07-14 NOTE — Assessment & Plan Note (Signed)
 Managed with amlodipine 5 mg daily, chlorthalidone 25 mg daily, and losartan 100 mg daily. Blood pressure well-controlled. - Continue current antihypertensive medications

## 2023-07-14 NOTE — Assessment & Plan Note (Signed)
 Managed with Synthroid 50 mcg daily. Due for thyroid function test. - Order thyroid function test

## 2023-07-14 NOTE — Assessment & Plan Note (Signed)
 Managed with Lipitor 40 mg daily. Cholesterol levels to be checked at next physical. - Check cholesterol levels at next physical

## 2023-07-14 NOTE — Progress Notes (Signed)
 Established patient visit   Patient: Joyce Morton   DOB: [birth year]   73 y.o. Female  MRN: [id number] Visit Date: 07/14/2023  Today's healthcare provider: Jon Eva, MD   Chief Complaint  Patient presents with   Medical Management of Chronic Issues    6 month follow-up   Hypertension   Hypothyroidism    Symptom: Constipation   Hyperlipidemia   Diabetes   Subjective    HPI HPI     Medical Management of Chronic Issues    Additional comments: 6 month follow-up        Hypertension   Anxiety: Absent.  Blurred vision: Absent.  Chest pain: Absent.  Chest pressure/discomfort: Absent.  Dyspnea: Absent.  Headaches: Absent.  Lower extremity edema: Absent.  Orthopnea: Absent.  Palpitations: Absent.  Paroxysmal nocturnal dyspnea: Absent.  Syncope: Absent.        Hypothyroidism    Additional comments: Symptom: Constipation        Hyperlipidemia   Chest pain: Absent.  Chest pressure/discomfort: Absent.  Dyspnea: Absent.  Focal weakness: Absent.  Lower extremity edema: Absent.  Numbness or tingling of extremity: Absent.  Orthopnea: Absent.  Palpitations: Absent.  Paroxysmal nocturnal dyspnea: Absent.  Speech difficulty: Absent.  Syncope: Absent.        Diabetes   Compliance with treatment is excellent.  Blurred vision: Absent.  Chest pain: Absent.  Fatigue: Absent.  Foot Ulcerations: Absent.  Nausea: Absent.  Paresthesia of the feet: Absent.  Polydipsia: Absent.  Polyuria: Absent.  Visual changes: Absent.  Vomiting: Absent.  Weight loss: Present.  Episodes of hypoglycemia: Absent.  Home glucose fasting ranges  from 110-130 mg/dl.  Eye exam is current.      Last edited by Lilian Fitzpatrick, CMA on 07/14/2023  2:36 PM.       Discussed the use of AI scribe software for clinical note transcription with the patient, who gave verbal consent to proceed.  History of Present Illness   A 73 year old patient with a history of type two diabetes, hypertension,  hyperlipidemia, and hypothyroidism presents for a chronic disease follow-up. The patient reports a persistent cough for over a week, which has not significantly improved with over-the-counter cough syrup. The cough is productive but the sputum is not colored. The patient denies any associated sinus pressure, congestion, ear pain, or shortness of breath. The patient has been managing her chronic conditions with amlodipine , Lipitor, chlorthalidone , Synthroid , losartan , and metformin  XR.        Medications: Outpatient Medications Prior to Visit  Medication Sig   Accu-Chek Softclix Lancets lancets Use as instructed   acetaminophen  (TYLENOL ) 500 MG tablet Take 1,000 mg by mouth every 6 (six) hours as needed.   amLODipine  (NORVASC ) 5 MG tablet TAKE 1 TABLET(5 MG) BY MOUTH DAILY   atorvastatin  (LIPITOR) 40 MG tablet TAKE 1 TABLET(40 MG) BY MOUTH DAILY AT 6 PM   blood glucose meter kit and supplies KIT Dispense based on patient and insurance preference. Use 2-4 x weekly.   chlorthalidone  (HYGROTON ) 25 MG tablet Take 1 tablet (25 mg total) by mouth daily.   levothyroxine  (SYNTHROID ) 50 MCG tablet Take 1 tablet (50 mcg total) by mouth daily before breakfast.   losartan  (COZAAR ) 100 MG tablet Take 1 tablet (100 mg total) by mouth daily.   metFORMIN  (GLUCOPHAGE -XR) 750 MG 24 hr tablet Take 1 tablet (750 mg total) by mouth 2 (two) times daily.   Multiple Vitamin (MULTIVITAMIN) tablet Take 1 tablet by mouth daily.   [  DISCONTINUED] glucose blood (ACCU-CHEK AVIVA PLUS) test strip To check blood sugar daily   No facility-administered medications prior to visit.    Review of Systems      Objective    BP 138/87 (BP Location: Left Arm, Patient Position: Sitting, Cuff Size: Normal)   Pulse 95   Ht 5' 4 (1.626 m)   Wt 179 lb (81.2 kg)   SpO2 97%   BMI 30.73 kg/m    Physical Exam Vitals reviewed.  Constitutional:      General: She is not in acute distress.    Appearance: Normal appearance. She is  well-developed. She is not diaphoretic.  HENT:     Head: Normocephalic and atraumatic.  Eyes:     General: No scleral icterus.    Conjunctiva/sclera: Conjunctivae normal.  Neck:     Thyroid : No thyromegaly.  Cardiovascular:     Rate and Rhythm: Normal rate and regular rhythm.     Heart sounds: Normal heart sounds. No murmur heard. Pulmonary:     Effort: Pulmonary effort is normal. No respiratory distress.     Breath sounds: Normal breath sounds. No wheezing, rhonchi or rales.  Musculoskeletal:     Cervical back: Neck supple.     Right lower leg: No edema.     Left lower leg: No edema.  Lymphadenopathy:     Cervical: No cervical adenopathy.  Skin:    General: Skin is warm and dry.     Findings: No rash.  Neurological:     Mental Status: She is alert and oriented to person, place, and time. Mental status is at baseline.  Psychiatric:        Mood and Affect: Mood normal.        Behavior: Behavior normal.      No results found for any visits on 07/14/23.  Assessment & Plan     Problem List Items Addressed This Visit       Cardiovascular and Mediastinum   Hypertension associated with diabetes (HCC)   Managed with amlodipine  5 mg daily, chlorthalidone  25 mg daily, and losartan  100 mg daily. Blood pressure well-controlled. - Continue current antihypertensive medications      Relevant Orders   Basic Metabolic Panel (BMET)     Endocrine   Hyperlipidemia associated with type 2 diabetes mellitus (HCC)   Managed with Lipitor 40 mg daily. Cholesterol levels to be checked at next physical. - Check cholesterol levels at next physical      Diabetes mellitus, type II (HCC) - Primary   Managed with metformin  XR 750 mg bid. No issues with current medications. Due for A1c and kidney function tests. Foot exam performed. - Order A1c test - Order kidney function and electrolyte tests - Refill test strips      Relevant Medications   glucose blood (ACCU-CHEK AVIVA PLUS) test strip    Other Relevant Orders   Hemoglobin A1c   Hypothyroidism   Managed with Synthroid  50 mcg daily. Due for thyroid  function test. - Order thyroid  function test      Relevant Orders   TSH     Other   Obesity   Discussed importance of healthy weight management Discussed diet and exercise            Cough Cough for over a week, likely viral. No sinus pressure, congestion, ear pain, or shortness of breath. Thick but non-discolored sputum. Limited relief from OTC cough syrup. Discussed Tessalon  pearls and honey, noting honey's efficacy in studies. Advised monitoring for  fever or shortness of breath and potential chest x-ray if symptoms persist or worsen. - Prescribe Tessalon  pearls, take up to twice a day as needed - Recommend honey for cough relief - Advise to monitor symptoms and report if fever or shortness of breath develop - Consider chest x-ray if symptoms persist or worsen  General Health Maintenance Routine follow-up for chronic conditions. Due for eye exam. - Schedule eye exam - Send lab slip for tests  Follow-up - Schedule follow-up physical in six months - Advise to report if cough symptoms persist or worsen.        Return in about 6 months (around 01/11/2024) for CPE.       Jon Eva, MD  [facility] [phone] (phone) [phone] (fax)  [facility]

## 2023-07-14 NOTE — Assessment & Plan Note (Signed)
 Discussed importance of healthy weight management Discussed diet and exercise

## 2023-07-14 NOTE — Assessment & Plan Note (Signed)
 Managed with metformin XR 750 mg bid. No issues with current medications. Due for A1c and kidney function tests. Foot exam performed. - Order A1c test - Order kidney function and electrolyte tests - Refill test strips

## 2023-07-15 ENCOUNTER — Other Ambulatory Visit: Payer: Self-pay

## 2023-07-15 LAB — BASIC METABOLIC PANEL
BUN/Creatinine Ratio: 29 — ABNORMAL HIGH (ref 12–28)
BUN: 23 mg/dL (ref 8–27)
CO2: 25 mmol/L (ref 20–29)
Calcium: 10.7 mg/dL — ABNORMAL HIGH (ref 8.7–10.3)
Chloride: 97 mmol/L (ref 96–106)
Creatinine, Ser: 0.78 mg/dL (ref 0.57–1.00)
Glucose: 141 mg/dL — ABNORMAL HIGH (ref 70–99)
Potassium: 4.4 mmol/L (ref 3.5–5.2)
Sodium: 141 mmol/L (ref 134–144)
eGFR: 80 mL/min/{1.73_m2} (ref >59)

## 2023-07-15 LAB — HEMOGLOBIN A1C
Est. average glucose Bld gHb Est-mCnc: 140 mg/dL
Hgb A1c MFr Bld: 6.5 % — ABNORMAL HIGH (ref 4.8–5.6)

## 2023-07-15 LAB — TSH: TSH: 3.96 u[IU]/mL (ref 0.450–4.500)

## 2023-07-25 ENCOUNTER — Telehealth: Payer: Self-pay

## 2023-07-25 NOTE — Telephone Encounter (Signed)
Copied from CRM [id number]. Topic: Appointments - Scheduling Inquiry for Clinic >> Jul 25, 2023  9:15 AM Joyce Morton wrote: Reason for CRM: Pt is wanting to schedule a new patient appointment for July 2025 with Dr. Darrick Huntsman, since she is not accepting new patients at this time, the patient wanted me to send a message to see if she would make an exception. She stated she was highly recommended by a friend Dennie Bible D.  Pt callback [phone]

## 2023-07-25 NOTE — Telephone Encounter (Signed)
Will you schedule pt for new pt appt.

## 2023-07-29 NOTE — Telephone Encounter (Signed)
Patient was called and messge left to call back to schedule new patient appointment in July with Dr Darrick Huntsman.

## 2023-08-27 LAB — PTH, INTACT AND CALCIUM
Calcium: 9.7 mg/dL (ref 8.7–10.3)
PTH: 21 pg/mL (ref 15–65)

## 2023-08-27 LAB — VITAMIN D 25 HYDROXY (VIT D DEFICIENCY, FRACTURES): Vit D, 25-Hydroxy: 41.5 ng/mL (ref 30.0–100.0)

## 2023-08-28 ENCOUNTER — Telehealth: Payer: Self-pay | Admitting: Family Medicine

## 2023-08-28 ENCOUNTER — Encounter: Payer: Self-pay | Admitting: Family Medicine

## 2023-08-28 DIAGNOSIS — I10 Essential (primary) hypertension: Secondary | ICD-10-CM

## 2023-08-28 MED ORDER — CHLORTHALIDONE 25 MG PO TABS
25.0000 mg | ORAL_TABLET | Freq: Every day | ORAL | 1 refills | Status: DC
Start: 2023-08-28 — End: 2024-02-04

## 2023-08-28 NOTE — Addendum Note (Signed)
 Addended by: Lily Kocher on: 08/28/2023 03:34 PM   Modules accepted: Orders

## 2023-08-28 NOTE — Telephone Encounter (Signed)
 [facility] in [place]  is requesting refills on Chlorthalidone  80 mg. #90

## 2023-09-01 ENCOUNTER — Ambulatory Visit: Payer: Self-pay | Admitting: Family Medicine

## 2023-09-01 NOTE — Telephone Encounter (Signed)
 Chief Complaint: Cough Symptoms: clear sputum Frequency: Ongoing since January Pertinent Negatives: Patient denies fever, SOB Disposition: [] ED /[] Urgent Care (no appt availability in office) / [x] Appointment(In office/virtual)/ []  [facility]/ [] Home Care/ [] Refused Recommended Disposition /[]  Mobile Bus/ []  Follow-up with PCP Additional Notes: Pt reports she was seen in January for cough which has continued since. Pt reports occasional clear sputum. Denies SOB, fever, wheezing, congestion. OV scheduled tomorrow. This RN educated pt on home care, new-worsening symptoms, when to call back/seek emergent care. Pt verbalized understanding and agrees to plan.    Copied from CRM [id number]. Topic: Clinical - Medical Advice >> Sep 01, 2023  8:12 AM Everette C wrote: Reason for CRM: The patient shares that they were seen for a cough in late January and prescribed medication to help their symptoms  The patient shares that the cough sounds similar to a chronic smokers cough, but they do not smoke. The patient has noticed clear mucus but no other major symptoms.   The patient would like to speak with a member of clinical staff about their concerns further Reason for Disposition  Cough has been present for > 3 weeks  Answer Assessment - Initial Assessment Questions 1. ONSET: "When did the cough begin?"      Began in January, was seen in office  3. SPUTUM: "Describe the color of your sputum" (none, dry cough; clear, white, yellow, green)     Clear 4. HEMOPTYSIS: "Are you coughing up any blood?" If so ask: "How much?" (flecks, streaks, tablespoons, etc.)     None 5. DIFFICULTY BREATHING: "Are you having difficulty breathing?" If Yes, ask: "How bad is it?" (e.g., mild, moderate, severe)    - MILD: No SOB at rest, mild SOB with walking, speaks normally in sentences, can lie down, no retractions, pulse < 100.    - MODERATE: SOB at rest, SOB with minimal exertion and prefers to sit,  cannot lie down flat, speaks in phrases, mild retractions, audible wheezing, pulse 100-120.    - SEVERE: Very SOB at rest, speaks in single words, struggling to breathe, sitting hunched forward, retractions, pulse > 120      None 6. FEVER: "Do you have a fever?" If Yes, ask: "What is your temperature, how was it measured, and when did it start?"     None 10. OTHER SYMPTOMS: "Do you have any other symptoms?" (e.g., runny nose, wheezing, chest pain)       None  Protocols used: Cough - Acute Productive-A-AH

## 2023-09-01 NOTE — Telephone Encounter (Signed)
 Noted.

## 2023-09-02 ENCOUNTER — Encounter: Payer: Self-pay | Admitting: Family Medicine

## 2023-09-02 ENCOUNTER — Ambulatory Visit: Admitting: Family Medicine

## 2023-09-02 VITALS — BP 134/70 | HR 92 | Ht 63.0 in | Wt 181.5 lb

## 2023-09-02 DIAGNOSIS — R052 Subacute cough: Secondary | ICD-10-CM | POA: Diagnosis not present

## 2023-09-02 DIAGNOSIS — I10 Essential (primary) hypertension: Secondary | ICD-10-CM | POA: Diagnosis not present

## 2023-09-02 MED ORDER — PREDNISONE 20 MG PO TABS: 40.0000 mg | ORAL_TABLET | Freq: Every day | ORAL | 0 refills | Status: AC

## 2023-09-02 NOTE — Progress Notes (Signed)
 Acute visit   Patient: Joyce Morton   DOB: 07-22-1950   73 y.o. Female  MRN: [id number] PCP: Erasmo Downer, MD   Chief Complaint  Patient presents with   Cough    Ongoing productive cough since January. Reports she doesn't feel as bad but it is causing her to have some soreness in her chest . Patient reports she is currently taking coricidin as well as hunny, tea with honey and even honey cough drops. If she does have phlegm it is clear and sounds like a "smokers cough"   Subjective    Discussed the use of AI scribe software for clinical note transcription with the patient, who gave verbal consent to proceed.  History of Present Illness   The patient, a former smoker who quit twenty years ago, presents with a persistent cough that has been ongoing for about one and a half to two months. The cough has been productive, and the patient reports chest soreness but denies any shortness of breath or wheezing. The patient has not been feeling sick and has been able to carry out her normal activities. The patient has tried various remedies including cough syrup, pills, and honey, but the cough has persisted. The patient reports that the cough has been so severe that it has disrupted her sleep and social activities. The patient has not had a fever or any other symptoms.        Review of Systems   Objective    BP 134/70 (BP Location: Right Arm, Patient Position: Sitting, Cuff Size: Large)   Pulse 92   Ht 5\' 3"  (1.6 m)   Wt 181 lb 8 oz (82.3 kg)   SpO2 99%   BMI 32.15 kg/m   Physical Exam Cardiovascular:     Rate and Rhythm: Normal rate and regular rhythm.  Pulmonary:     Effort: Pulmonary effort is normal. No respiratory distress.     Breath sounds: Wheezing present.      Physical Exam   VITALS: BP- 134/70 CHEST: End expiratory wheezing at lung bases.        No results found for any visits on 09/02/23.  Assessment & Plan     Problem List Items Addressed  This Visit   None Visit Diagnoses       Subacute cough    -  Primary           Post-viral cough with end-expiratory wheezing Persistent productive cough since January, lasting approximately two months, with associated chest and throat soreness. No fever or systemic symptoms. End-expiratory wheezing in the lower lungs indicates inflammation. Suspected post-viral cough due to residual airway inflammation. - Prescribe prednisone 40 mg daily for 7 days - Advise taking prednisone in the morning with food to minimize side effects - Instruct to monitor blood pressure and report significant increases - Send prescription to [facility] at [place] - Advise to start prednisone the following morning to avoid insomnia - Instruct to contact the office if symptoms do not improve  Hypertension Blood pressure initially elevated at 141/72 mmHg, decreased to 134/70 mmHg after rest. Prednisone may cause a temporary increase in blood pressure, but this is not expected to be significant. - Recheck blood pressure before leaving the office - Advise monitoring blood pressure during prednisone treatment - Instruct to report significant increases in blood pressure        Meds ordered this encounter  Medications   predniSONE (DELTASONE) 20 MG tablet  Sig: Take 2 tablets (40 mg total) by mouth daily with breakfast for 7 days.    Dispense:  14 tablet    Refill:  0     Return for as scheduled.      Shirlee Latch, MD  [facility] [phone] (phone) [phone] (fax)  [facility]

## 2023-09-04 LAB — HM DIABETES EYE EXAM

## 2023-11-02 ENCOUNTER — Other Ambulatory Visit: Payer: Self-pay | Admitting: Family Medicine

## 2023-11-03 ENCOUNTER — Telehealth: Payer: Self-pay | Admitting: Family Medicine

## 2023-11-03 DIAGNOSIS — E1169 Type 2 diabetes mellitus with other specified complication: Secondary | ICD-10-CM

## 2023-11-03 NOTE — Telephone Encounter (Signed)
 [facility] pharmacy is requesting refill metFORMIN  (GLUCOPHAGE -XR) 750 MG 24 hr tablet  & atorvastatin  (LIPITOR) 40 MG tablet  & losartan  (COZAAR ) 100 MG tablet   Please advise

## 2023-11-04 ENCOUNTER — Other Ambulatory Visit: Payer: Self-pay

## 2023-11-04 DIAGNOSIS — E119 Type 2 diabetes mellitus without complications: Secondary | ICD-10-CM

## 2023-11-04 DIAGNOSIS — I10 Essential (primary) hypertension: Secondary | ICD-10-CM

## 2023-11-04 DIAGNOSIS — E1169 Type 2 diabetes mellitus with other specified complication: Secondary | ICD-10-CM

## 2023-11-04 MED ORDER — LOSARTAN POTASSIUM 100 MG PO TABS
100.0000 mg | ORAL_TABLET | Freq: Every day | ORAL | 1 refills | Status: DC
Start: 2023-11-04 — End: 2024-04-30

## 2023-11-04 MED ORDER — METFORMIN HCL ER 750 MG PO TB24
750.0000 mg | ORAL_TABLET | Freq: Two times a day (BID) | ORAL | 1 refills | Status: DC
Start: 2023-11-04 — End: 2024-05-07

## 2023-11-10 ENCOUNTER — Telehealth: Payer: Self-pay | Admitting: Family Medicine

## 2023-11-10 DIAGNOSIS — E1169 Type 2 diabetes mellitus with other specified complication: Secondary | ICD-10-CM

## 2023-11-10 NOTE — Telephone Encounter (Signed)
 [facility] pharmacy is requesting refill atorvastatin (LIPITOR) 40 MG tablet   Please advise

## 2023-11-11 ENCOUNTER — Other Ambulatory Visit: Payer: Self-pay | Admitting: Family Medicine

## 2023-11-11 DIAGNOSIS — E785 Hyperlipidemia, unspecified: Secondary | ICD-10-CM

## 2023-11-11 NOTE — Telephone Encounter (Signed)
 Copied from CRM [id number]. Topic: Clinical - Medication Refill >> Nov 11, 2023  4:22 PM Turkey B wrote: Medication: atorvastatin  (LIPITOR) 40 MG tablet   Has the patient contacted their pharmacy? yes (Agent: If yes, when and what did the pharmacy advise?)contact pcp/ denied saying requesting too soon, pt has some left until Thursday  This is the patient's preferred pharmacy:  [id number] - [place], [place] - [address] AT [facility] [address] [place] [address] [place] [address] Phone: [phone] Fax: [phone]  Is this the correct pharmacy for this prescription? yes If no, delete pharmacy and type the correct one.   Has the prescription been filled recently? no  Is the patient out of the medication? no  Has the patient been seen for an appointment in the last year OR does the patient have an upcoming appointment? yes  Can we respond through MyChart? yes  Agent: Please be advised that Rx refills may take up to 3 business days. We ask that you follow-up with your pharmacy.

## 2023-11-13 ENCOUNTER — Telehealth: Payer: Self-pay | Admitting: Family Medicine

## 2023-11-13 DIAGNOSIS — E1169 Type 2 diabetes mellitus with other specified complication: Secondary | ICD-10-CM

## 2023-11-13 NOTE — Telephone Encounter (Signed)
 Requested Prescriptions  Refused Prescriptions Disp Refills   atorvastatin  (LIPITOR) 40 MG tablet 90 tablet 1     Cardiovascular:  Antilipid - Statins Failed - 11/13/2023 11:32 AM      Failed - Valid encounter within last 12 months    Recent Outpatient Visits           2 months ago Subacute cough   [place] [facility] [place], Stan Eans, MD       Future Appointments             In 2 months Bacigalupo, Stan Eans, MD [facility]            Failed - Lipid Panel in normal range within the last 12 months    Cholesterol, Total  Date Value Ref Range Status  11/14/2022 145 100 - 199 mg/dL Final   LDL Chol Calc (NIH)  Date Value Ref Range Status  11/14/2022 61 0 - 99 mg/dL Final   HDL  Date Value Ref Range Status  11/14/2022 69 >39 mg/dL Final   Triglycerides  Date Value Ref Range Status  11/14/2022 77 0 - 149 mg/dL Final         Passed - Patient is not pregnant

## 2023-11-13 NOTE — Telephone Encounter (Signed)
 [facility] pharmacy is requesting refill atorvastatin (LIPITOR) 40 MG tablet   Please advise

## 2023-12-09 IMAGING — MG MM DIGITAL SCREENING BILAT W/ TOMO AND CAD
8 of 15 series · 8 of 40 positions shown · non-contrast
Comparison: None available.

CLINICAL DATA: Screening.

EXAM:
DIGITAL SCREENING BILATERAL MAMMOGRAM WITH TOMOSYNTHESIS AND CAD
TECHNIQUE: Bilateral screening digital craniocaudal and mediolateral oblique
mammograms were obtained. Bilateral screening digital breast
tomosynthesis was performed. The images were evaluated with
computer-aided detection.

[R CC synth-2D (1 of 2)]
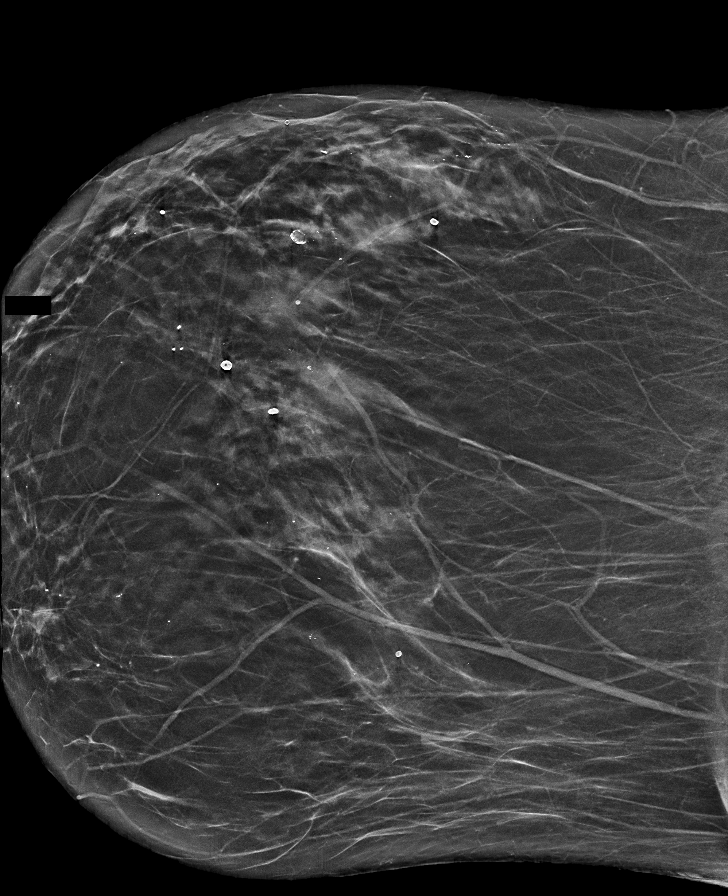

[L MLO synth-2D (1 of 2)]
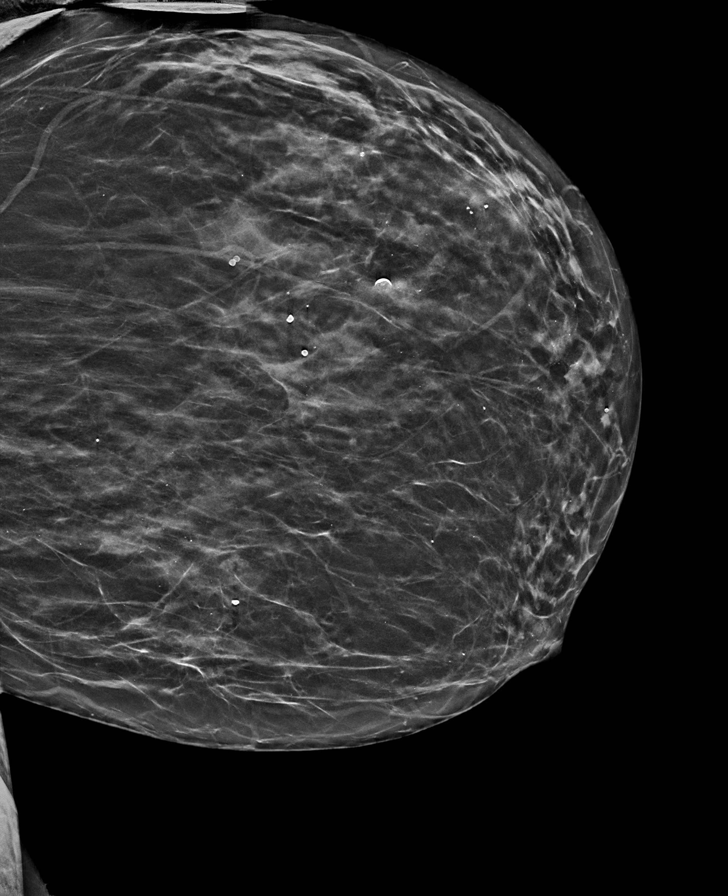

[L CC synth-2D]
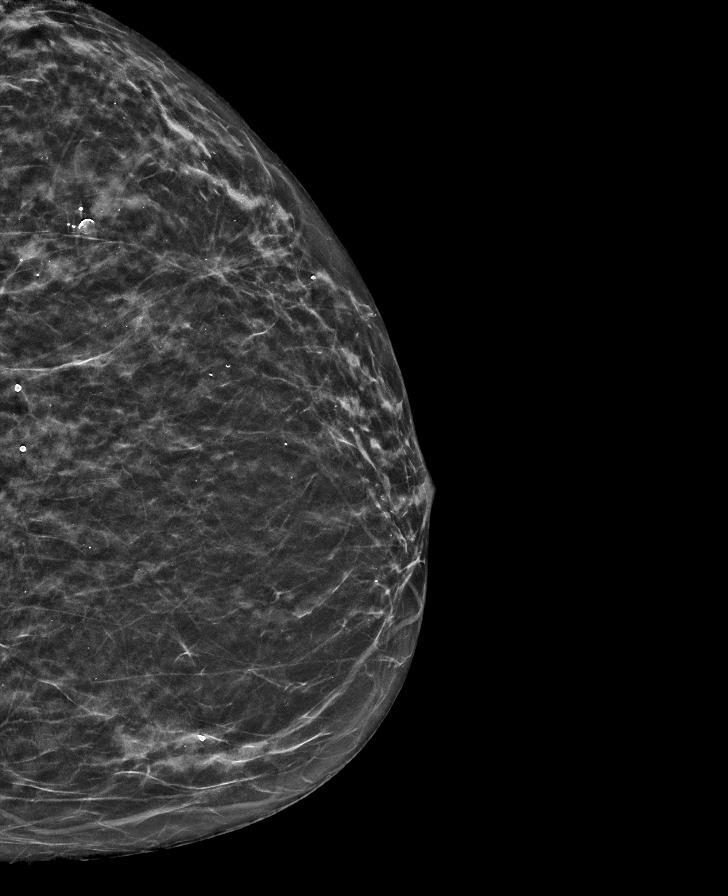

[R MLO synth-2D (1 of 2)]
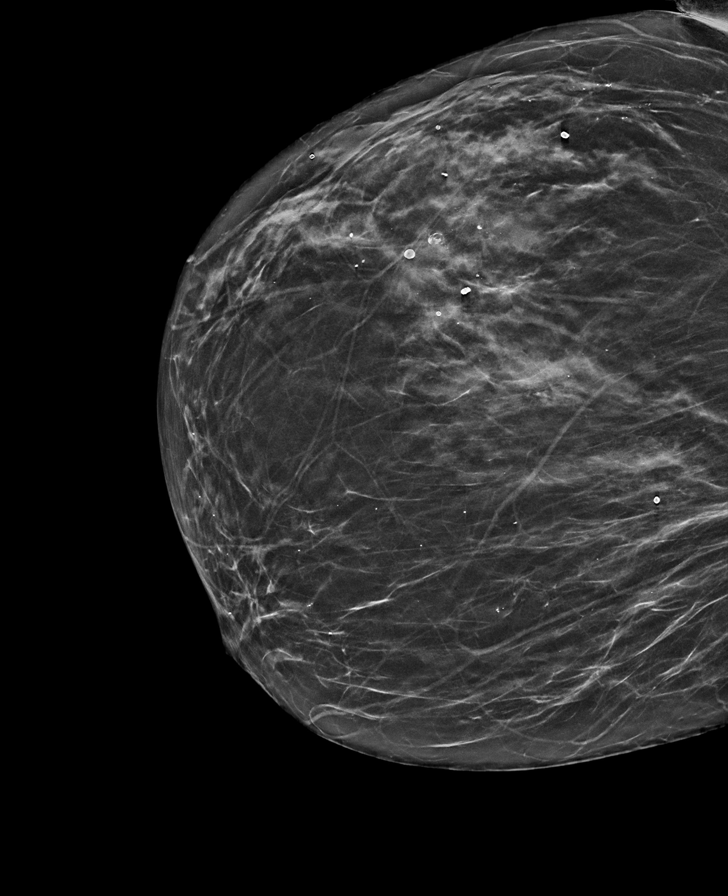

[L MLO synth-2D (2 of 2)]
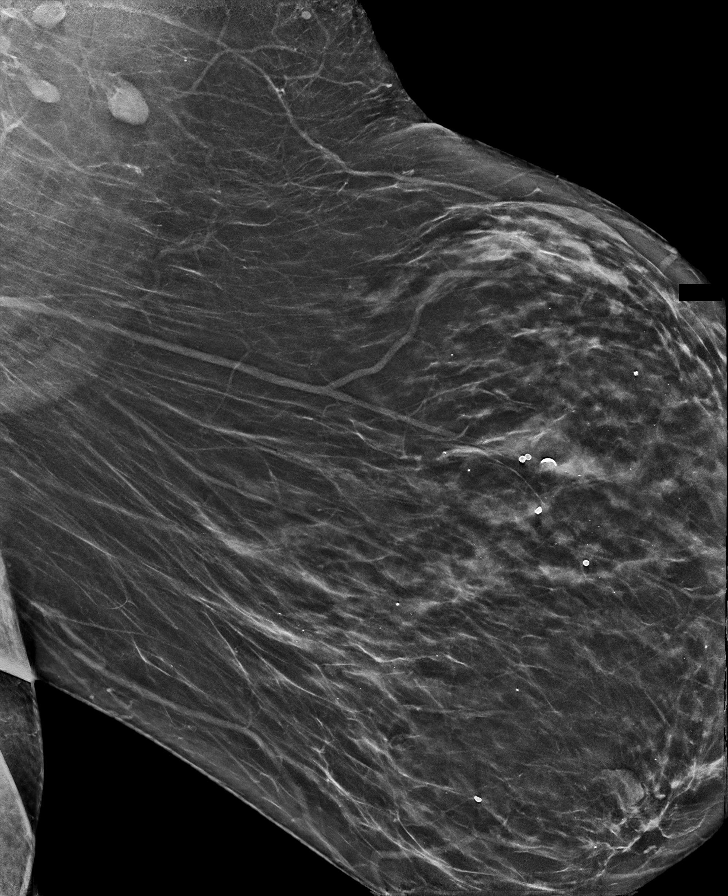

[R MLO synth-2D (2 of 2)]
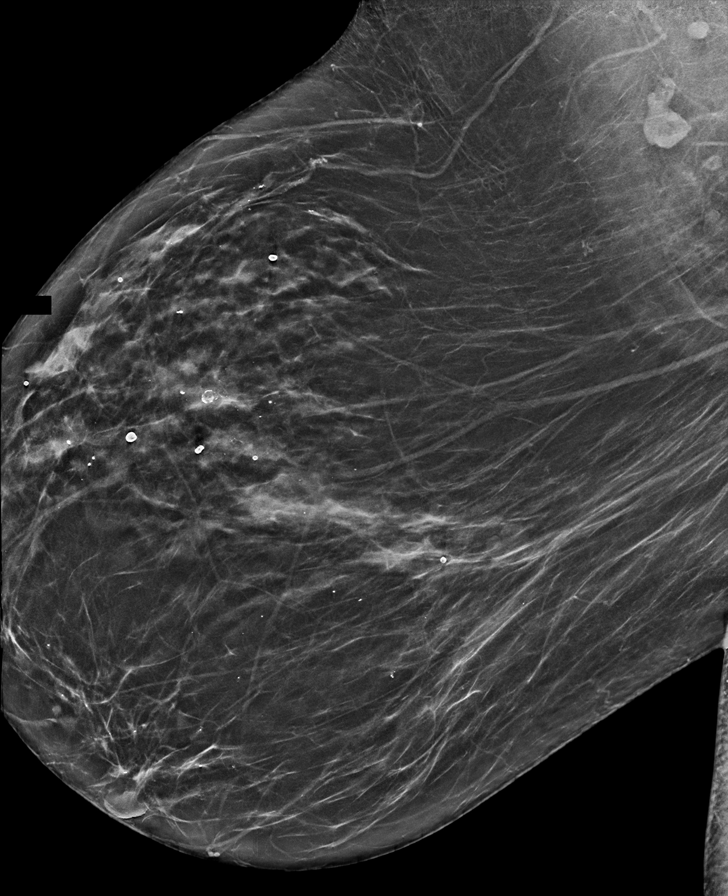

[R CC synth-2D (2 of 2)]
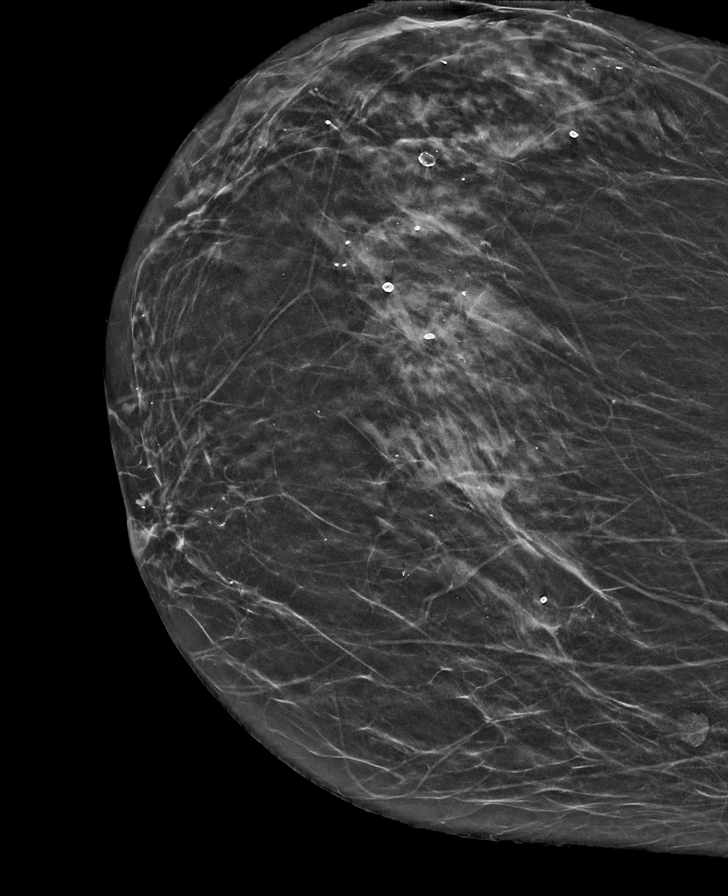

[R CC tomo · tomo slice 40/58.0]
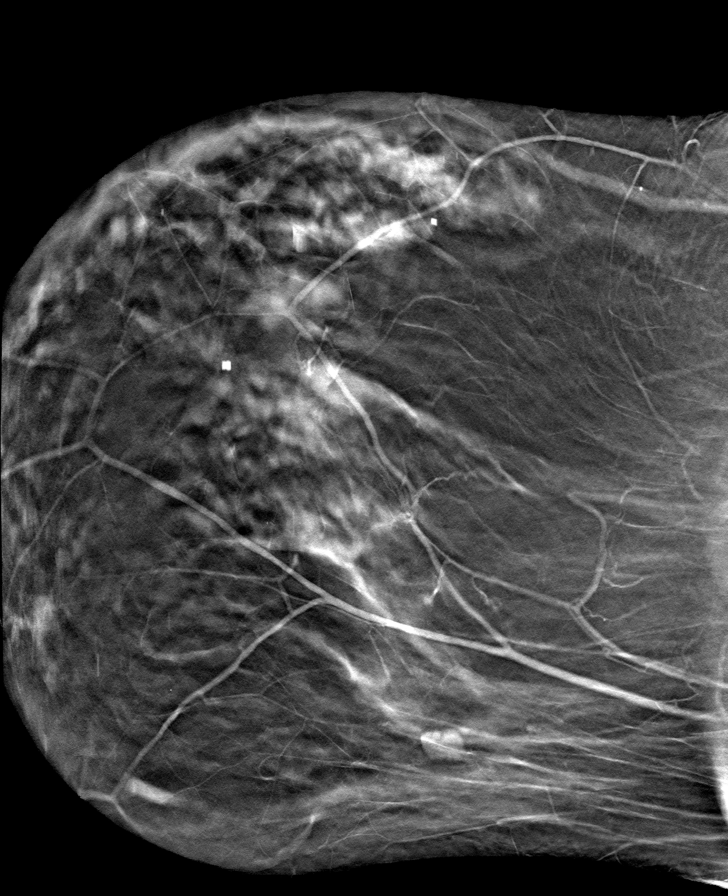

[8 of 40 positions shown; findings below may reference images not displayed]

ACR Breast Density Category b: There are scattered areas of
fibroglandular density.
FINDINGS: There are no findings suspicious for malignancy.
IMPRESSION: No mammographic evidence of malignancy. A result letter of this
screening mammogram will be mailed directly to the patient.

RECOMMENDATION:
Screening mammogram in one year. (Code:GB-Z-FIU)

BI-RADS CATEGORY  1: Negative.

## 2023-12-19 ENCOUNTER — Other Ambulatory Visit: Payer: Self-pay

## 2023-12-19 ENCOUNTER — Telehealth: Payer: Self-pay | Admitting: Family Medicine

## 2023-12-19 MED ORDER — AMLODIPINE BESYLATE 5 MG PO TABS: 5.0000 mg | ORAL_TABLET | Freq: Every day | ORAL | 1 refills | Status: DC

## 2023-12-19 NOTE — Telephone Encounter (Signed)
[facility] pharmacy faxed refill request for the following medications: ? ? ?amLODipine (NORVASC) 5 MG tablet  ? ?Please advise ? ?

## 2024-01-15 ENCOUNTER — Ambulatory Visit: Payer: Self-pay | Admitting: Family Medicine

## 2024-01-15 ENCOUNTER — Encounter: Payer: Self-pay | Admitting: Family Medicine

## 2024-01-15 VITALS — BP 129/73 | HR 81 | Resp 16 | Ht 63.0 in | Wt 178.4 lb

## 2024-01-15 DIAGNOSIS — I152 Hypertension secondary to endocrine disorders: Secondary | ICD-10-CM

## 2024-01-15 DIAGNOSIS — E039 Hypothyroidism, unspecified: Secondary | ICD-10-CM

## 2024-01-15 DIAGNOSIS — Z0001 Encounter for general adult medical examination with abnormal findings: Secondary | ICD-10-CM

## 2024-01-15 DIAGNOSIS — E1169 Type 2 diabetes mellitus with other specified complication: Secondary | ICD-10-CM

## 2024-01-15 DIAGNOSIS — Z7984 Long term (current) use of oral hypoglycemic drugs: Secondary | ICD-10-CM

## 2024-01-15 DIAGNOSIS — E785 Hyperlipidemia, unspecified: Secondary | ICD-10-CM

## 2024-01-15 DIAGNOSIS — E1159 Type 2 diabetes mellitus with other circulatory complications: Secondary | ICD-10-CM

## 2024-01-15 DIAGNOSIS — Z1231 Encounter for screening mammogram for malignant neoplasm of breast: Secondary | ICD-10-CM

## 2024-01-15 DIAGNOSIS — Z0000 Encounter for general adult medical examination without abnormal findings: Secondary | ICD-10-CM

## 2024-01-15 NOTE — Assessment & Plan Note (Addendum)
 Well controlled on current medication regimen. - Continue Metformin  XR 750 mg bid - Order Hgb A1c - Order CMP for electrolytes and kidney function - Order Urine Pr/Cr - Performed bilateral diabetic foot exam with normal findings

## 2024-01-15 NOTE — Assessment & Plan Note (Signed)
 Controlled with current medication regimen. - Continue Lipitor 40 mg daily - Order lipid panel and adjust medication dosage based on results

## 2024-01-15 NOTE — Assessment & Plan Note (Addendum)
 Controlled with current dose of Synthroid . - Continue Synthroid  50 mcg daily. - Order TSH levels

## 2024-01-15 NOTE — Assessment & Plan Note (Addendum)
 Well controlled with current antihypertensive regimen. - Continue amlodipine  5 mg daily - Continue chlorthalidone  25 mg daily - Continue losartan  100 mg daily - Order CMP

## 2024-01-15 NOTE — Progress Notes (Signed)
 Annual Wellness Visit     Patient: Joyce Morton, Female    DOB: 11/06/1950, 73 y.o.   MRN: [id number]  Subjective  Chief Complaint  Patient presents with   Annual Exam   Medicare Wellness    Joyce Morton is a 73 y.o. female who presents today for her Annual Wellness Visit. She reports consuming a general diet. Home exercise routine includes walking 1 hrs per day. She generally feels well. She reports sleeping well. She does not have additional problems to discuss today.   HPI  Vision:Within last year     Medications: Outpatient Medications Prior to Visit  Medication Sig   Accu-Chek Softclix Lancets lancets Use as instructed   acetaminophen  (TYLENOL ) 500 MG tablet Take 1,000 mg by mouth every 6 (six) hours as needed.   amLODipine  (NORVASC ) 5 MG tablet Take 1 tablet (5 mg total) by mouth daily.   atorvastatin  (LIPITOR) 40 MG tablet TAKE 1 TABLET(40 MG) BY MOUTH DAILY AT 6 PM   Bacillus Coagulans-Inulin (PROBIOTIC) 1-250 BILLION-MG CAPS Take by mouth daily.   blood glucose meter kit and supplies KIT Dispense based on patient and insurance preference. Use 2-4 x weekly.   chlorthalidone  (HYGROTON ) 25 MG tablet Take 1 tablet (25 mg total) by mouth daily.   glucose blood (ACCU-CHEK AVIVA PLUS) test strip To check blood sugar daily   levothyroxine  (SYNTHROID ) 50 MCG tablet TAKE 1 TABLET(50 MCG) BY MOUTH DAILY BEFORE BREAKFAST   losartan  (COZAAR ) 100 MG tablet Take 1 tablet (100 mg total) by mouth daily.   metFORMIN  (GLUCOPHAGE -XR) 750 MG 24 hr tablet Take 1 tablet (750 mg total) by mouth 2 (two) times daily.   Multiple Vitamin (MULTIVITAMIN) tablet Take 1 tablet by mouth daily.   [DISCONTINUED] benzonatate  (TESSALON ) 100 MG capsule Take 1 capsule (100 mg total) by mouth 2 (two) times daily as needed for cough.   No facility-administered medications prior to visit.    Allergies  Allergen Reactions   Lisinopril Cough and Other (See Comments)    Patient unsure what  happens when she takes it   Wellbutrin [Bupropion] Hives    Patient Care Team: Myrla Jon HERO, MD as PCP - General (Family Medicine) [facility], [facility]  Review of Systems  Constitutional:  Negative for chills, diaphoresis and fever.  HENT:  Negative for hearing loss and tinnitus.   Eyes:  Negative for blurred vision and double vision.  Respiratory:  Negative for cough, shortness of breath and wheezing.   Cardiovascular:  Negative for chest pain, palpitations and leg swelling.  Gastrointestinal:  Negative for abdominal pain, constipation, diarrhea, nausea and vomiting.  Genitourinary:  Negative for dysuria, frequency and urgency.  Musculoskeletal:  Negative for myalgias.  Neurological:  Negative for dizziness and headaches.      Objective  BP 129/73 (BP Location: Left Arm, Patient Position: Sitting, Cuff Size: Normal)   Pulse 81   Resp 16   Ht 5' 3 (1.6 m)   Wt 178 lb 6.4 oz (80.9 kg)   SpO2 97%   BMI 31.60 kg/m    Physical Exam Vitals reviewed.  Constitutional:      General: She is not in acute distress.    Appearance: Normal appearance. She is not ill-appearing.  HENT:     Head: Normocephalic.     Right Ear: Tympanic membrane, ear canal and external ear normal.     Left Ear: Tympanic membrane, ear canal and external ear normal.     Nose: Nose  normal.     Mouth/Throat:     Mouth: Mucous membranes are moist.     Pharynx: Oropharynx is clear.  Eyes:     General: No scleral icterus.    Conjunctiva/sclera: Conjunctivae normal.     Pupils: Pupils are equal, round, and reactive to light.  Cardiovascular:     Rate and Rhythm: Normal rate and regular rhythm.     Pulses: Normal pulses.  Pulmonary:     Effort: Pulmonary effort is normal. No respiratory distress.     Breath sounds: Normal breath sounds. No wheezing.  Abdominal:     General: There is no distension.     Palpations: Abdomen is soft.     Tenderness: There is no abdominal tenderness.   Musculoskeletal:     Cervical back: Normal range of motion.     Right lower leg: No edema.     Left lower leg: No edema.  Lymphadenopathy:     Cervical: No cervical adenopathy.  Skin:    General: Skin is warm and dry.     Capillary Refill: Capillary refill takes less than 2 seconds.  Neurological:     Mental Status: She is alert and oriented to person, place, and time.     Most recent functional status assessment:    01/15/2024    8:12 AM  In your present state of health, do you have any difficulty performing the following activities:  Hearing? 1  Comment sometimes  Vision? 0  Difficulty concentrating or making decisions? 0  Walking or climbing stairs? 0  Dressing or bathing? 0  Doing errands, shopping? 0  Preparing Food and eating ? N  Using the Toilet? N  In the past six months, have you accidently leaked urine? N  Do you have problems with loss of bowel control? N  Managing your Medications? N  Managing your Finances? N  Housekeeping or managing your Housekeeping? N   Most recent fall risk assessment:    01/15/2024    8:08 AM  Fall Risk   Falls in the past year? 0  Number falls in past yr: 0  Injury with Fall? 0  Risk for fall due to : No Fall Risks    Most recent depression screenings:    01/15/2024    8:10 AM 11/14/2022    9:17 AM  PHQ 2/9 Scores  PHQ - 2 Score 0 0  PHQ- 9 Score  3   Most recent cognitive screening:    01/15/2024    8:16 AM  6CIT Screen  What Year? 0 points  What month? 0 points  What time? 0 points  Count back from 20 0 points  Months in reverse 0 points  Repeat phrase 0 points  Total Score 0 points   Most recent Audit-C alcohol use screening    01/15/2024    8:15 AM  Alcohol Use Disorder Test (AUDIT)  1. How often do you have a drink containing alcohol? 0  2. How many drinks containing alcohol do you have on a typical day when you are drinking? 0  3. How often do you have six or more drinks on one occasion? 0  AUDIT-C Score  0   A score of 3 or more in women, and 4 or more in men indicates increased risk for alcohol abuse, EXCEPT if all of the points are from question 1   Vision/Hearing Screen: No results found.    No results found for any visits on 01/15/24.    Assessment &  Plan   Annual wellness visit done today including the all of the following: Reviewed patient's Family Medical History Reviewed and updated list of patient's medical providers Assessment of cognitive impairment was done Assessed patient's functional ability Established a written schedule for health screening services Health Risk Assessent Completed and Reviewed  Exercise Activities and Dietary recommendations  Goals   None     Immunization History  Administered Date(s) Administered   Fluad Quad(high Dose 65+) 04/22/2022   Influenza, High Dose Seasonal PF 04/07/2018, 03/29/2019   Influenza-Unspecified 04/15/2015, 04/04/2017, 04/12/2020, 04/21/2023   PFIZER Comirnaty(Gray Top)Covid-19 Tri-Sucrose Vaccine 11/07/2020   PFIZER(Purple Top)SARS-COV-2 Vaccination 07/23/2019, 08/13/2019, 03/27/2020   Pneumococcal Conjugate-13 05/29/2015   Pneumococcal Polysaccharide-23 12/03/2016   Zoster Recombinant(Shingrix) 01/08/2018, 03/19/2018    Health Maintenance  Topic Date Due   DTaP/Tdap/Td (1 - Tdap) Never done   COVID-19 Vaccine (5 - 2024-25 season) 03/02/2023   OPHTHALMOLOGY EXAM  08/23/2023   Diabetic kidney evaluation - Urine ACR  11/14/2023   HEMOGLOBIN A1C  01/11/2024   INFLUENZA VACCINE  01/30/2024   Diabetic kidney evaluation - eGFR measurement  07/13/2024   MAMMOGRAM  01/08/2025   FOOT EXAM  01/14/2025   Medicare Annual Wellness (AWV)  01/14/2025   Colonoscopy  01/13/2028   Pneumococcal Vaccine: 50+ Years  Completed   DEXA SCAN  Completed   Hepatitis C Screening  Completed   Zoster Vaccines- Shingrix  Completed   Hepatitis B Vaccines  Aged Out   HPV VACCINES  Aged Out   Meningococcal B Vaccine  Aged Out      Discussed health benefits of physical activity, and encouraged her to engage in regular exercise appropriate for her age and condition.    Problem List Items Addressed This Visit       Cardiovascular and Mediastinum   Hypertension associated with diabetes (HCC)   Well controlled with current antihypertensive regimen. - Continue amlodipine  5 mg daily - Continue chlorthalidone  25 mg daily - Continue losartan  100 mg daily - Order CMP      Relevant Orders   Comprehensive metabolic panel with GFR     Endocrine   Hyperlipidemia associated with type 2 diabetes mellitus (HCC)   Controlled with current medication regimen. - Continue Lipitor 40 mg daily - Order lipid panel and adjust medication dosage based on results      Relevant Orders   Comprehensive metabolic panel with GFR   Lipid panel   Diabetes mellitus, type II (HCC)   Well controlled on current medication regimen. - Continue Metformin  XR 750 mg bid - Order Hgb A1c - Order CMP for electrolytes and kidney function - Order Urine Pr/Cr - Performed bilateral diabetic foot exam with normal findings      Relevant Orders   Urine microalbumin-creatinine with uACR   Hemoglobin A1c   Hypothyroidism   Controlled with current dose of Synthroid . - Continue Synthroid  50 mcg daily. - Order TSH levels      Relevant Orders   TSH   Other Visit Diagnoses       Encounter for annual wellness visit (AWV) in Medicare patient    -  Primary     Encounter for annual physical exam       Relevant Orders   Urine microalbumin-creatinine with uACR   Hemoglobin A1c   Comprehensive metabolic panel with GFR   Lipid panel   TSH     Breast cancer screening by mammogram       Relevant Orders   MM 3D SCREENING  MAMMOGRAM BILATERAL BREAST      General Health Maintenance  Patient reports receiving eye exam last week. - Reach out and request eye exam records  Patient reports not having recent Tdap vaccination and is interested in  receiving the vaccine. - Advised to receive vaccination at local pharmacy  Patient is due for mammogram - Ambulatory referral for mammogram  Return in about 6 months (around 07/17/2024) for chronic disease f/u.     Elia LULLA Blanch, Medical Student   Patient seen along with MS3 student, Elia Blanch. I personally evaluated this patient along with the student, and verified all aspects of the history, physical exam, and medical decision making as documented by the student. I agree with the student's documentation and have made all necessary edits.  Zarina Pe, Jon HERO, MD, MPH [facility]

## 2024-01-15 NOTE — Patient Instructions (Signed)
Consider asking at the pharmacy about the tetanus shot (TDAP) ?

## 2024-01-16 ENCOUNTER — Encounter: Payer: Medicare PPO | Admitting: Internal Medicine

## 2024-01-16 LAB — MICROALBUMIN / CREATININE URINE RATIO
Creatinine, Urine: 107.6 mg/dL
Microalb/Creat Ratio: 9 mg/g{creat} (ref 0–29)
Microalbumin, Urine: 10.2 ug/mL

## 2024-01-16 LAB — COMPREHENSIVE METABOLIC PANEL WITH GFR
ALT: 12 IU/L (ref 0–32)
AST: 18 IU/L (ref 0–40)
Albumin: 4.9 g/dL — ABNORMAL HIGH (ref 3.8–4.8)
Alkaline Phosphatase: 72 IU/L (ref 44–121)
BUN/Creatinine Ratio: 28 (ref 12–28)
BUN: 26 mg/dL (ref 8–27)
Bilirubin Total: 0.9 mg/dL (ref 0.0–1.2)
CO2: 23 mmol/L (ref 20–29)
Calcium: 9.9 mg/dL (ref 8.7–10.3)
Chloride: 98 mmol/L (ref 96–106)
Creatinine, Ser: 0.94 mg/dL (ref 0.57–1.00)
Globulin, Total: 2.4 g/dL (ref 1.5–4.5)
Glucose: 131 mg/dL — ABNORMAL HIGH (ref 70–99)
Potassium: 4.5 mmol/L (ref 3.5–5.2)
Sodium: 139 mmol/L (ref 134–144)
Total Protein: 7.3 g/dL (ref 6.0–8.5)
eGFR: 64 mL/min/1.73 (ref >59)

## 2024-01-16 LAB — LIPID PANEL
Chol/HDL Ratio: 2.6 ratio (ref 0.0–4.4)
Cholesterol, Total: 155 mg/dL (ref 100–199)
HDL: 60 mg/dL (ref >39)
LDL Chol Calc (NIH): 78 mg/dL (ref 0–99)
Triglycerides: 92 mg/dL (ref 0–149)
VLDL Cholesterol Cal: 17 mg/dL (ref 5–40)

## 2024-01-16 LAB — TSH: TSH: 3.43 u[IU]/mL (ref 0.450–4.500)

## 2024-01-16 LAB — HEMOGLOBIN A1C
Est. average glucose Bld gHb Est-mCnc: 134 mg/dL
Hgb A1c MFr Bld: 6.3 % — ABNORMAL HIGH (ref 4.8–5.6)

## 2024-01-19 ENCOUNTER — Ambulatory Visit: Payer: Self-pay | Admitting: Family Medicine

## 2024-01-22 ENCOUNTER — Other Ambulatory Visit (HOSPITAL_COMMUNITY): Payer: Self-pay

## 2024-01-22 ENCOUNTER — Other Ambulatory Visit: Payer: Self-pay | Admitting: Family Medicine

## 2024-01-22 DIAGNOSIS — E1169 Type 2 diabetes mellitus with other specified complication: Secondary | ICD-10-CM

## 2024-01-22 NOTE — Telephone Encounter (Unsigned)
 Copied from CRM [id number]. Topic: Clinical - Medication Refill >> Jan 22, 2024  8:17 AM Myrick T wrote: Medication: atorvastatin  (LIPITOR) 40 MG tablet  Has the patient contacted their pharmacy? Yes  This is the patient's preferred pharmacy:  [id number] - [place], [place] - [address] AT [facility] OF [address] [place] [address] [place] [address] Phone: [phone] Fax: [phone]  Is this the correct pharmacy for this prescription? Yes  Has the prescription been filled recently? Yes  Is the patient out of the medication? Yes  Has the patient been seen for an appointment in the last year OR does the patient have an upcoming appointment? Yes  Can we respond through MyChart? Yes  Agent: Please be advised that Rx refills may take up to 3 business days. We ask that you follow-up with your pharmacy.

## 2024-01-23 MED ORDER — ATORVASTATIN CALCIUM 40 MG PO TABS: 40.0000 mg | ORAL_TABLET | Freq: Every day | ORAL | 3 refills | Status: AC

## 2024-01-23 NOTE — Telephone Encounter (Signed)
 Requested Prescriptions  Pending Prescriptions Disp Refills   atorvastatin  (LIPITOR) 40 MG tablet 90 tablet 3    Sig: Take 1 tablet (40 mg total) by mouth daily.     Cardiovascular:  Antilipid - Statins Failed - 01/23/2024 12:21 PM      Failed - Lipid Panel in normal range within the last 12 months    Cholesterol, Total  Date Value Ref Range Status  01/15/2024 155 100 - 199 mg/dL Final   LDL Chol Calc (NIH)  Date Value Ref Range Status  01/15/2024 78 0 - 99 mg/dL Final   HDL  Date Value Ref Range Status  01/15/2024 60 >39 mg/dL Final   Triglycerides  Date Value Ref Range Status  01/15/2024 92 0 - 149 mg/dL Final         Passed - Patient is not pregnant      Passed - Valid encounter within last 12 months    Recent Outpatient Visits           1 week ago Encounter for annual wellness visit (AWV) in Medicare patient   [place] [facility], Jon HERO, MD   4 months ago Subacute cough   [place] [facility] [place], Jon HERO, MD

## 2024-02-04 ENCOUNTER — Other Ambulatory Visit: Payer: Self-pay | Admitting: Family Medicine

## 2024-02-04 DIAGNOSIS — I10 Essential (primary) hypertension: Secondary | ICD-10-CM

## 2024-02-09 ENCOUNTER — Ambulatory Visit
Admission: RE | Admit: 2024-02-09 | Discharge: 2024-02-09 | Disposition: A | Discharge status: Home / Self Care | Source: Ambulatory Visit | Attending: Family Medicine | Admitting: Family Medicine

## 2024-02-09 DIAGNOSIS — Z1231 Encounter for screening mammogram for malignant neoplasm of breast: Secondary | ICD-10-CM | POA: Insufficient documentation

## 2024-04-30 ENCOUNTER — Telehealth: Payer: Self-pay | Admitting: Family Medicine

## 2024-04-30 DIAGNOSIS — E039 Hypothyroidism, unspecified: Secondary | ICD-10-CM

## 2024-04-30 DIAGNOSIS — I10 Essential (primary) hypertension: Secondary | ICD-10-CM

## 2024-04-30 MED ORDER — AMLODIPINE BESYLATE 5 MG PO TABS: 5.0000 mg | ORAL_TABLET | Freq: Every day | ORAL | 1 refills | Status: AC

## 2024-04-30 MED ORDER — LEVOTHYROXINE SODIUM 50 MCG PO TABS: 50.0000 ug | ORAL_TABLET | Freq: Every day | ORAL | 1 refills | Status: DC

## 2024-04-30 MED ORDER — LOSARTAN POTASSIUM 100 MG PO TABS: 100.0000 mg | ORAL_TABLET | Freq: Every day | ORAL | 1 refills | Status: AC

## 2024-04-30 NOTE — Telephone Encounter (Addendum)
 [facility] faxed refill request for the following medications:  losartan  (COZAAR ) 100 MG tablet   levothyroxine  (SYNTHROID ) 50 MCG tablet   amLODipine  (NORVASC ) 5 MG tablet    Please advise.

## 2024-05-07 ENCOUNTER — Other Ambulatory Visit: Payer: Self-pay | Admitting: Family Medicine

## 2024-05-07 DIAGNOSIS — E119 Type 2 diabetes mellitus without complications: Secondary | ICD-10-CM

## 2024-07-19 ENCOUNTER — Encounter: Payer: Self-pay | Admitting: Family Medicine

## 2024-07-19 ENCOUNTER — Ambulatory Visit: Admitting: Family Medicine

## 2024-07-19 VITALS — BP 128/65 | HR 90 | Resp 14 | Ht 63.0 in | Wt 183.8 lb

## 2024-07-19 DIAGNOSIS — E66811 Obesity, class 1: Secondary | ICD-10-CM | POA: Diagnosis not present

## 2024-07-19 DIAGNOSIS — E039 Hypothyroidism, unspecified: Secondary | ICD-10-CM | POA: Diagnosis not present

## 2024-07-19 DIAGNOSIS — E1169 Type 2 diabetes mellitus with other specified complication: Secondary | ICD-10-CM

## 2024-07-19 DIAGNOSIS — E119 Type 2 diabetes mellitus without complications: Secondary | ICD-10-CM

## 2024-07-19 DIAGNOSIS — I152 Hypertension secondary to endocrine disorders: Secondary | ICD-10-CM

## 2024-07-19 DIAGNOSIS — Z7984 Long term (current) use of oral hypoglycemic drugs: Secondary | ICD-10-CM

## 2024-07-19 DIAGNOSIS — E1159 Type 2 diabetes mellitus with other circulatory complications: Secondary | ICD-10-CM | POA: Diagnosis not present

## 2024-07-19 DIAGNOSIS — Z6832 Body mass index (BMI) 32.0-32.9, adult: Secondary | ICD-10-CM | POA: Diagnosis not present

## 2024-07-19 DIAGNOSIS — E785 Hyperlipidemia, unspecified: Secondary | ICD-10-CM

## 2024-07-19 LAB — POCT GLYCOSYLATED HEMOGLOBIN (HGB A1C): Hemoglobin A1C: 6 % — AB (ref 4.0–5.6)

## 2024-07-19 MED ORDER — METFORMIN HCL ER 750 MG PO TB24: 750.0000 mg | ORAL_TABLET | Freq: Every day | ORAL | 1 refills | Status: AC

## 2024-07-19 NOTE — Assessment & Plan Note (Signed)
 Recent A1c of 6.3, indicating good control. Current regimen includes metformin  XR 750 mg BID. She reports difficulty with twice-daily dosing and occasional missed doses. Blood sugar levels have been variable, likely due to recent holidays. Goal is to maintain A1c under 7. - Checked A1c today. - If A1c is under 6.8, will decrease metformin  to 750 mg once daily. A1c 6.0 - decreased metformin  as we discussed - If A1c is elevated, will reassess treatment plan. - Discussed potential for taking both metformin  pills in the morning if twice-daily dosing remains challenging.

## 2024-07-19 NOTE — Assessment & Plan Note (Signed)
 Chronic and well controlled on Atorvastatin  40 mg daily. - Continue atorvastatin  40 mg daily.

## 2024-07-19 NOTE — Progress Notes (Signed)
 "     Established patient visit   Patient: Joyce Morton   DOB: October 24, 1950   74 y.o. Female  MRN: [id number] Visit Date: 07/19/2024  Today's healthcare provider: Jon Eva, MD   Chief Complaint  Patient presents with   Medical Management of Chronic Issues    6 month f/u Concerns: Metformin  med.  Was wondering if she can go back to taking once a day, forgets to take the 2nd pill at times   Subjective    HPI HPI     Medical Management of Chronic Issues    Additional comments: 6 month f/u Concerns: Metformin  med.  Was wondering if she can go back to taking once a day, forgets to take the 2nd pill at times      Last edited by Wilfred Hargis RAMAN, CMA on 07/19/2024  8:25 AM.       Discussed the use of AI scribe software for clinical note transcription with the patient, who gave verbal consent to proceed.  History of Present Illness   Joyce Morton is a 74 year old female with diabetes, hypertension, hyperlipidemia, and hypothyroidism who presents for chronic follow-up.  She is concerned about her metformin  regimen, currently 750 mg twice daily. She sometimes skips the evening dose due to inconvenience and asks about returning to a once-daily regimen. Her last A1c was 6.3 and her fasting blood sugar was 109 this morning, though she notes recent variability. She asks if medication can be reduced if A1c remains controlled.  Her medications are amlodipine  5 mg daily, atorvastatin  40 mg daily, chlorthalidone  25 mg daily, Synthroid  50 mcg daily, losartan  100 mg daily, and metformin  XR 750 mg twice daily. She tried to relax before the visit to help with blood pressure. She reports regular eye exams, most recently in March.  She stays active with her grandsons high school band events, which keeps her regularly engaged and on her feet.       Medications: Show/hide medication list[1]  Review of Systems     Objective    BP 128/65   Pulse 90   Resp 14   Ht  5' 3 (1.6 m)   Wt 183 lb 12.8 oz (83.4 kg)   SpO2 100%   BMI 32.56 kg/m    Physical Exam Vitals reviewed.  Constitutional:      General: She is not in acute distress.    Appearance: Normal appearance. She is well-developed. She is not diaphoretic.  HENT:     Head: Normocephalic and atraumatic.  Eyes:     General: No scleral icterus.    Conjunctiva/sclera: Conjunctivae normal.  Neck:     Thyroid : No thyromegaly.  Cardiovascular:     Rate and Rhythm: Normal rate and regular rhythm.     Heart sounds: Normal heart sounds. No murmur heard. Pulmonary:     Effort: Pulmonary effort is normal. No respiratory distress.     Breath sounds: Normal breath sounds. No wheezing, rhonchi or rales.  Musculoskeletal:     Cervical back: Neck supple.     Right lower leg: No edema.     Left lower leg: No edema.  Lymphadenopathy:     Cervical: No cervical adenopathy.  Skin:    General: Skin is warm and dry.     Findings: No rash.  Neurological:     Mental Status: She is alert and oriented to person, place, and time. Mental status is at baseline.  Psychiatric:  Mood and Affect: Mood normal.        Behavior: Behavior normal.      Results for orders placed or performed in visit on 07/19/24  POCT HgB A1C  Result Value Ref Range   Hemoglobin A1C 6.0 (A) 4.0 - 5.6 %   HbA1c POC (<> result, manual entry)     HbA1c, POC (prediabetic range)     HbA1c, POC (controlled diabetic range)      Assessment & Plan     Problem List Items Addressed This Visit       Cardiovascular and Mediastinum   Hypertension associated with diabetes (HCC)   Well-controlled with current medication regimen. Blood pressure readings are satisfactory. - Continue current antihypertensive medications: amlodipine , chlorthalidone , and losartan .      Relevant Medications   metFORMIN  (GLUCOPHAGE -XR) 750 MG 24 hr tablet   Other Relevant Orders   Basic Metabolic Panel (BMET)     Endocrine   Hyperlipidemia  associated with type 2 diabetes mellitus (HCC)   Chronic and well controlled on Atorvastatin  40 mg daily. - Continue atorvastatin  40 mg daily.      Relevant Medications   metFORMIN  (GLUCOPHAGE -XR) 750 MG 24 hr tablet   Diabetes mellitus, type II (HCC) - Primary   Recent A1c of 6.3, indicating good control. Current regimen includes metformin  XR 750 mg BID. She reports difficulty with twice-daily dosing and occasional missed doses. Blood sugar levels have been variable, likely due to recent holidays. Goal is to maintain A1c under 7. - Checked A1c today. - If A1c is under 6.8, will decrease metformin  to 750 mg once daily. A1c 6.0 - decreased metformin  as we discussed - If A1c is elevated, will reassess treatment plan. - Discussed potential for taking both metformin  pills in the morning if twice-daily dosing remains challenging.      Relevant Medications   metFORMIN  (GLUCOPHAGE -XR) 750 MG 24 hr tablet   Other Relevant Orders   Urine Microalbumin w/creat. ratio   POCT HgB A1C (Completed)   Hypothyroidism   Chronic and previously well controlled.  Managed with Synthroid . - Continue Synthroid  50 mcg daily. - Ordered TSH as part of routine lab work.       Relevant Orders   TSH     Other   Obesity   Discussed importance of healthy weight management Discussed diet and exercise       Relevant Medications   metFORMIN  (GLUCOPHAGE -XR) 750 MG 24 hr tablet     Return in about 6 months (around 01/16/2025) for CPE.       Jon Eva, MD  [facility] [phone] (phone) [phone] (fax)  [facility]     [1]  Outpatient Medications Prior to Visit  Medication Sig   Accu-Chek Softclix Lancets lancets Use as instructed   acetaminophen  (TYLENOL ) 500 MG tablet Take 1,000 mg by mouth every 6 (six) hours as needed.   amLODipine  (NORVASC ) 5 MG tablet Take 1 tablet (5 mg total) by mouth daily.   atorvastatin  (LIPITOR) 40 MG tablet Take 1  tablet (40 mg total) by mouth daily.   Bacillus Coagulans-Inulin (PROBIOTIC) 1-250 BILLION-MG CAPS Take by mouth daily.   blood glucose meter kit and supplies KIT Dispense based on patient and insurance preference. Use 2-4 x weekly.   chlorthalidone  (HYGROTON ) 25 MG tablet TAKE 1 TABLET(25 MG) BY MOUTH DAILY   glucose blood (ACCU-CHEK AVIVA PLUS) test strip To check blood sugar daily   levothyroxine  (SYNTHROID ) 50 MCG tablet Take 1  tablet (50 mcg total) by mouth daily before breakfast.   losartan  (COZAAR ) 100 MG tablet Take 1 tablet (100 mg total) by mouth daily.   Multiple Vitamin (MULTIVITAMIN) tablet Take 1 tablet by mouth daily.   [DISCONTINUED] metFORMIN  (GLUCOPHAGE -XR) 750 MG 24 hr tablet TAKE 1 TABLET(750 MG) BY MOUTH TWICE DAILY   No facility-administered medications prior to visit.   "

## 2024-07-19 NOTE — Assessment & Plan Note (Signed)
 Chronic and previously well controlled.  Managed with Synthroid . - Continue Synthroid  50 mcg daily. - Ordered TSH as part of routine lab work.

## 2024-07-19 NOTE — Assessment & Plan Note (Signed)
 Well-controlled with current medication regimen. Blood pressure readings are satisfactory. - Continue current antihypertensive medications: amlodipine , chlorthalidone , and losartan .

## 2024-07-19 NOTE — Assessment & Plan Note (Signed)
 Discussed importance of healthy weight management Discussed diet and exercise

## 2024-07-20 ENCOUNTER — Ambulatory Visit: Payer: Self-pay | Admitting: Family Medicine

## 2024-07-20 DIAGNOSIS — E039 Hypothyroidism, unspecified: Secondary | ICD-10-CM

## 2024-07-20 LAB — BASIC METABOLIC PANEL WITH GFR
BUN/Creatinine Ratio: 21 (ref 12–28)
BUN: 18 mg/dL (ref 8–27)
CO2: 23 mmol/L (ref 20–29)
Calcium: 9.5 mg/dL (ref 8.7–10.3)
Chloride: 102 mmol/L (ref 96–106)
Creatinine, Ser: 0.85 mg/dL (ref 0.57–1.00)
Glucose: 130 mg/dL — ABNORMAL HIGH (ref 70–99)
Potassium: 4.5 mmol/L (ref 3.5–5.2)
Sodium: 141 mmol/L (ref 134–144)
eGFR: 72 mL/min/1.73

## 2024-07-20 LAB — MICROALBUMIN / CREATININE URINE RATIO
Creatinine, Urine: 88.6 mg/dL
Microalb/Creat Ratio: 14 mg/g{creat} (ref 0–29)
Microalbumin, Urine: 12.4 ug/mL

## 2024-07-20 LAB — TSH: TSH: 5.25 u[IU]/mL — ABNORMAL HIGH (ref 0.450–4.500)

## 2024-07-20 LAB — SPECIMEN STATUS REPORT

## 2024-07-20 MED ORDER — LEVOTHYROXINE SODIUM 75 MCG PO TABS: 75.0000 ug | ORAL_TABLET | Freq: Every day | ORAL | 1 refills | Status: AC

## 2024-07-20 NOTE — Telephone Encounter (Signed)
 Copied from CRM [id number]. Topic: General - Other >> Jul 20, 2024  1:21 PM Victoria B wrote: Reason for CRM: Patient called in, states she is ok, with the increase of the Synthroid , to 75mcg

## 2024-08-06 ENCOUNTER — Other Ambulatory Visit: Payer: Self-pay | Admitting: Family Medicine

## 2024-08-06 DIAGNOSIS — I10 Essential (primary) hypertension: Secondary | ICD-10-CM

## 2025-01-18 ENCOUNTER — Encounter: Admitting: Family Medicine
